# Patient Record
Sex: Male | Born: 1950 | Race: White | Hispanic: No | Marital: Single | Smoking: Former smoker
Health system: Southern US, Community
[De-identification: ages and names within clinical notes are randomized; demographics above are authoritative.]

## PROBLEM LIST (undated history)

## (undated) DIAGNOSIS — I2510 Atherosclerotic heart disease of native coronary artery without angina pectoris: Secondary | ICD-10-CM

## (undated) DIAGNOSIS — K859 Acute pancreatitis without necrosis or infection, unspecified: Secondary | ICD-10-CM

## (undated) DIAGNOSIS — M1990 Unspecified osteoarthritis, unspecified site: Secondary | ICD-10-CM

## (undated) DIAGNOSIS — C801 Malignant (primary) neoplasm, unspecified: Secondary | ICD-10-CM

## (undated) DIAGNOSIS — I10 Essential (primary) hypertension: Secondary | ICD-10-CM

## (undated) DIAGNOSIS — R0602 Shortness of breath: Secondary | ICD-10-CM

## (undated) DIAGNOSIS — F329 Major depressive disorder, single episode, unspecified: Secondary | ICD-10-CM

## (undated) DIAGNOSIS — F32A Depression, unspecified: Secondary | ICD-10-CM

## (undated) DIAGNOSIS — B1920 Unspecified viral hepatitis C without hepatic coma: Secondary | ICD-10-CM

## (undated) HISTORY — PX: ABDOMINAL SURGERY: SHX537

## (undated) HISTORY — PX: CHOLECYSTECTOMY: SHX55

## (undated) HISTORY — PX: FRACTURE SURGERY: SHX138

## (undated) HISTORY — PX: HERNIA REPAIR: SHX51

---

## 1997-08-29 ENCOUNTER — Inpatient Hospital Stay (HOSPITAL_COMMUNITY): Admission: AD | Admit: 1997-08-29 | Discharge: 1997-09-02 | Payer: Self-pay | Admitting: Cardiovascular Disease

## 2007-10-11 ENCOUNTER — Inpatient Hospital Stay: Payer: Self-pay | Admitting: Psychiatry

## 2007-10-11 ENCOUNTER — Other Ambulatory Visit: Payer: Self-pay

## 2008-09-28 ENCOUNTER — Inpatient Hospital Stay: Payer: Self-pay | Admitting: Internal Medicine

## 2009-04-19 ENCOUNTER — Emergency Department: Payer: Self-pay | Admitting: Unknown Physician Specialty

## 2009-05-25 ENCOUNTER — Inpatient Hospital Stay: Payer: Self-pay | Admitting: Internal Medicine

## 2009-05-29 ENCOUNTER — Inpatient Hospital Stay: Payer: Self-pay | Admitting: *Deleted

## 2009-06-16 ENCOUNTER — Ambulatory Visit: Payer: Self-pay | Admitting: Gastroenterology

## 2009-06-19 ENCOUNTER — Inpatient Hospital Stay: Payer: Self-pay | Admitting: Internal Medicine

## 2009-06-23 ENCOUNTER — Ambulatory Visit: Payer: Self-pay | Admitting: Gastroenterology

## 2010-05-25 ENCOUNTER — Ambulatory Visit: Payer: Self-pay | Admitting: Internal Medicine

## 2010-05-27 IMAGING — US US CAROTID DUPLEX BILAT
1 series · 17 of 24 positions shown · non-contrast
Comparison: none

REASON FOR EXAM: syncope
COMMENTS:

[Series 1: us carotid duplex bilat · 17 of 62 slices shown]
[im 1/62]
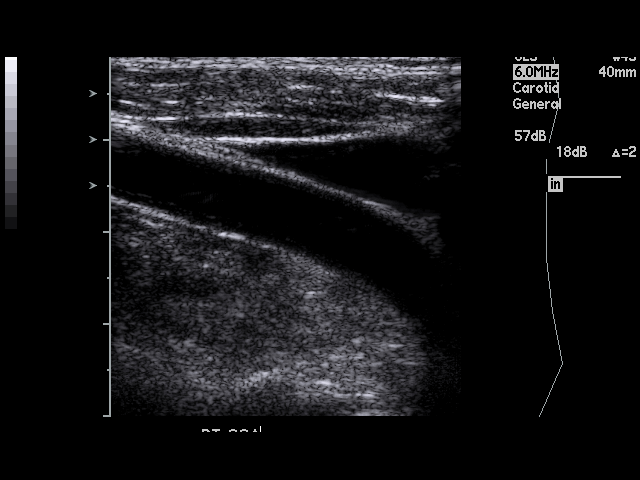
[im 6/62]
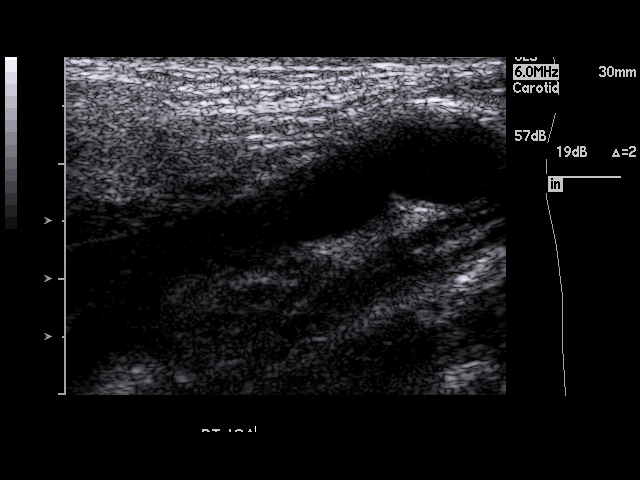
[im 8/62]
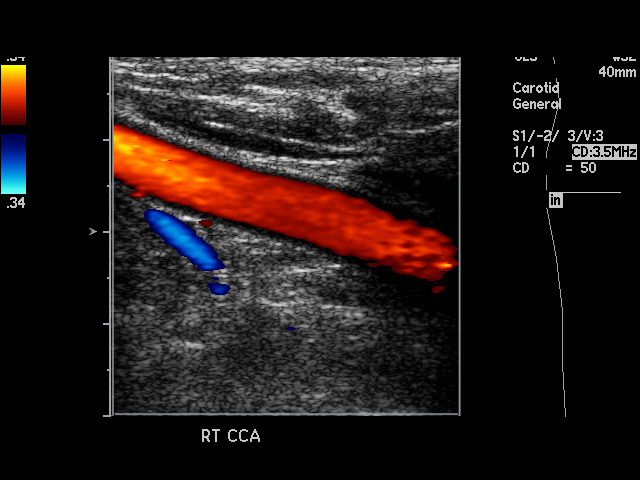
[im 11/62]
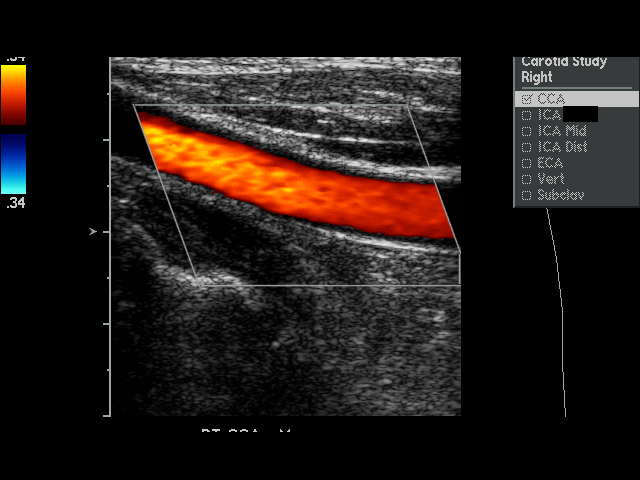
[im 16/62]
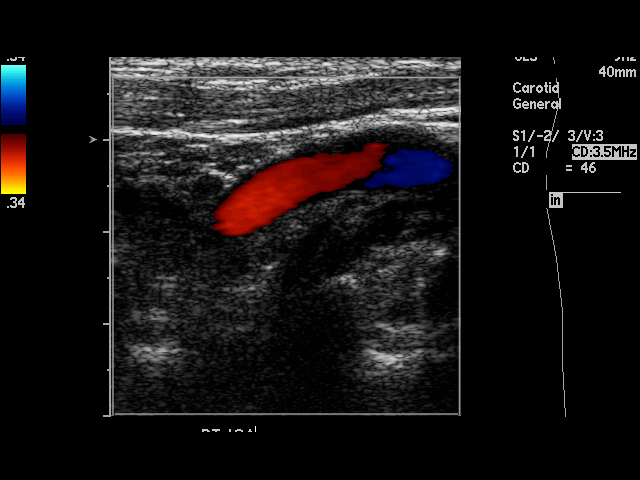
[im 19/62]
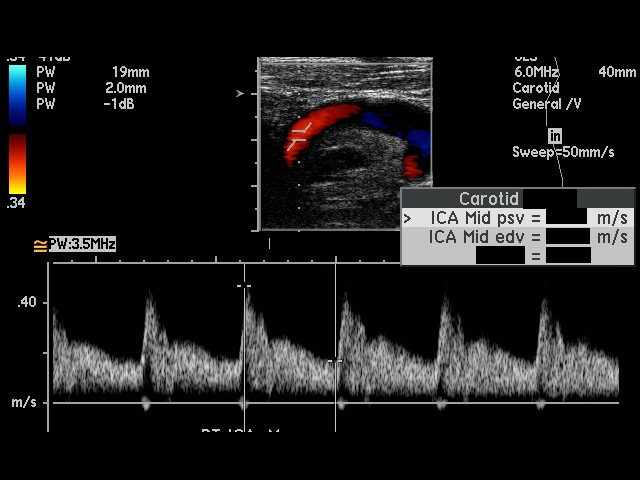
[im 24/62]
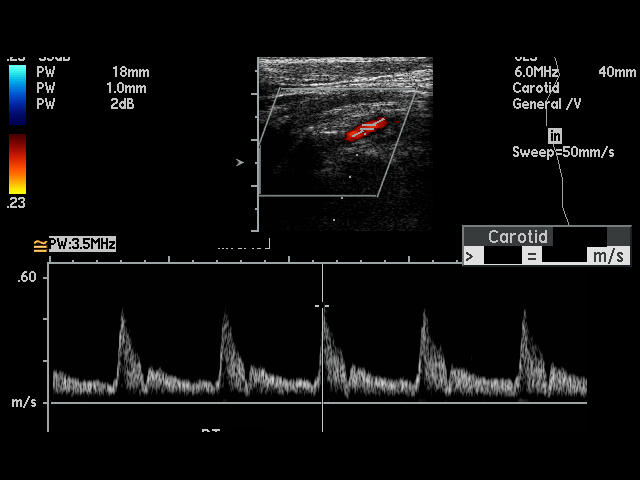
[im 27/62]
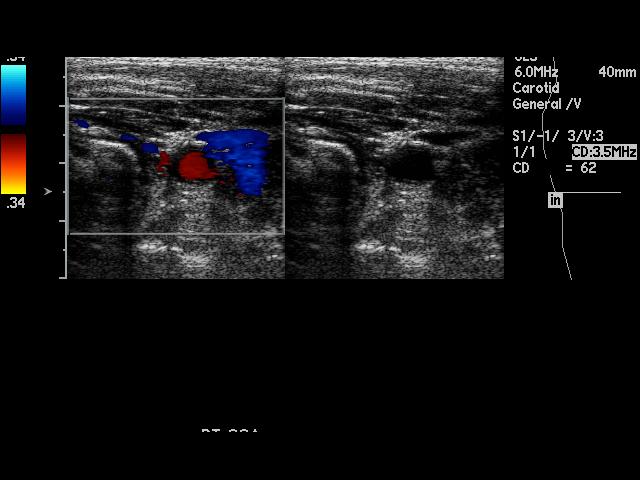
[im 32/62]
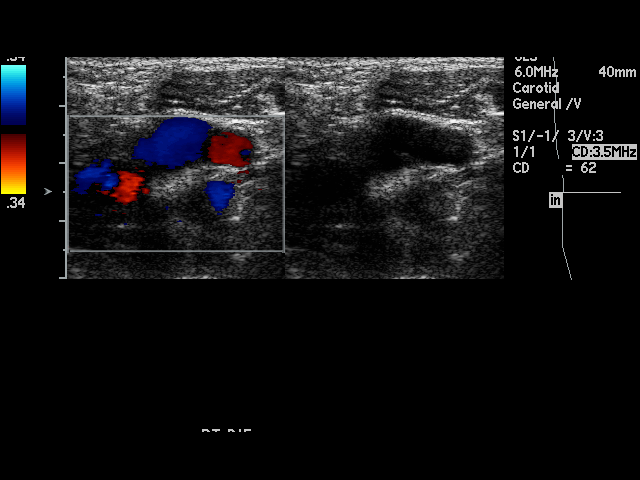
[im 35/62]
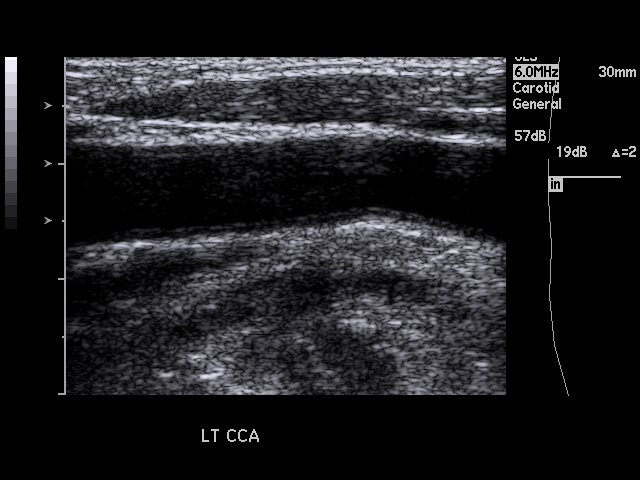
[im 38/62]
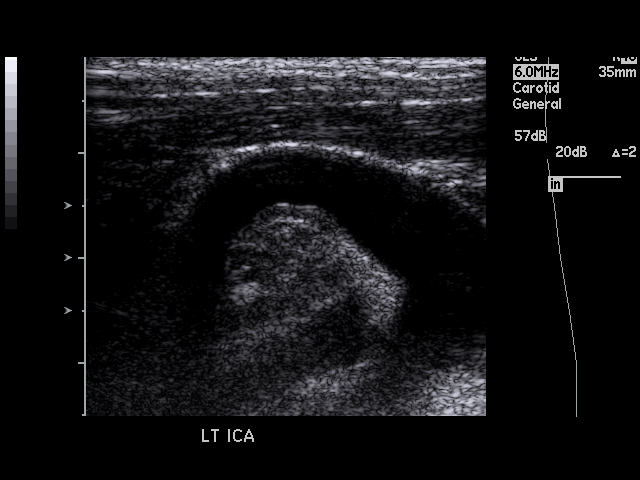
[im 43/62]
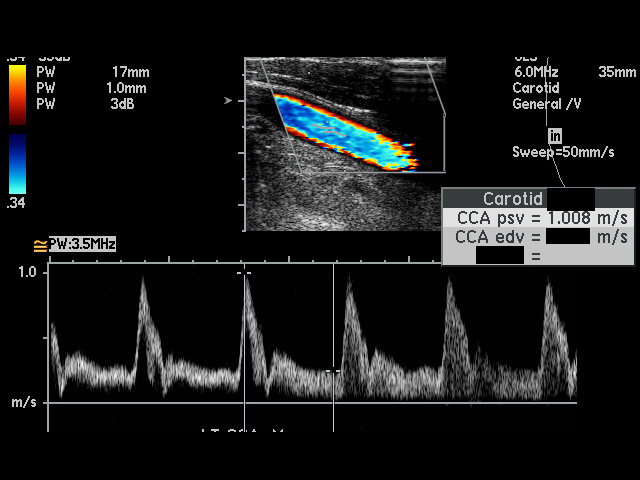
[im 46/62]
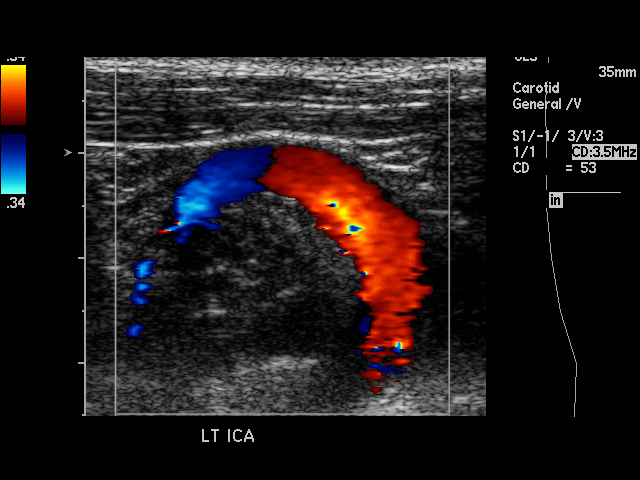
[im 51/62]
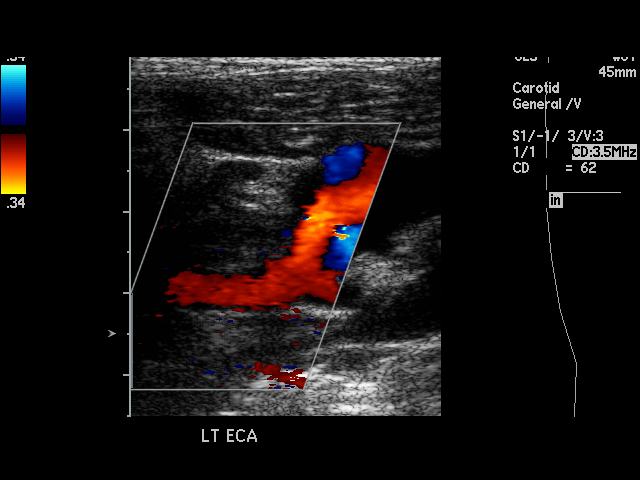
[im 54/62]
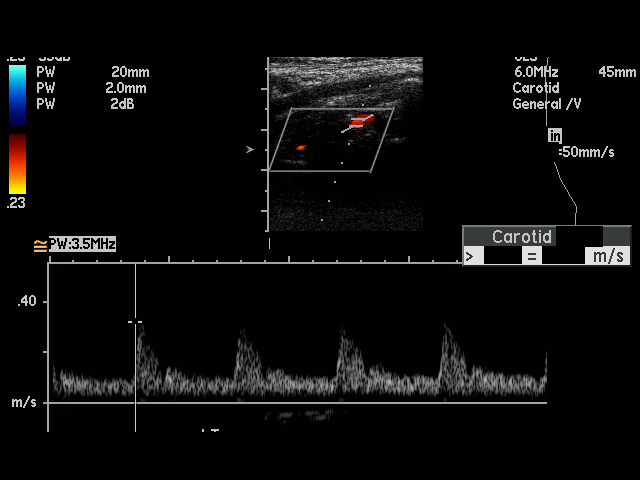
[im 56/62]
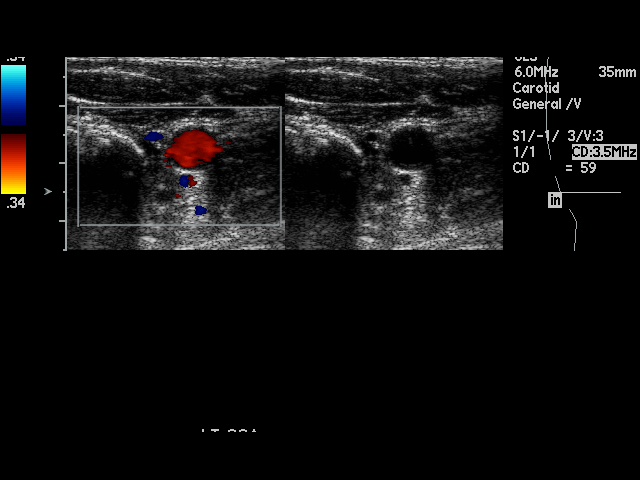
[im 62/62]
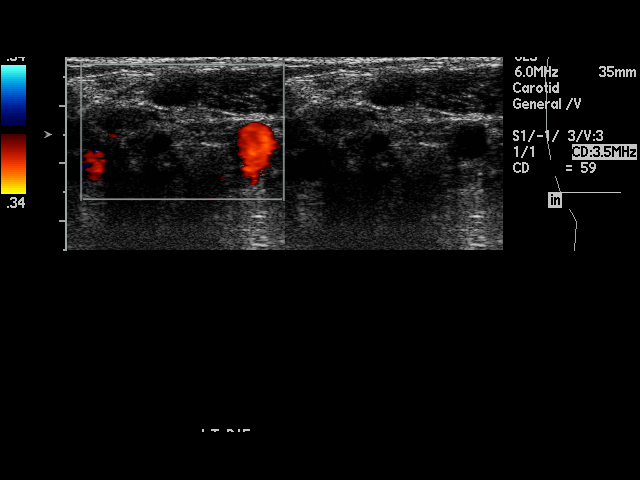

[17 of 24 positions shown; findings below may reference images not displayed]

PROCEDURE:     US  - US CAROTID DOPPLER BILATERAL  - May 26, 2009 [DATE]

RESULT:     Carotid Doppler interrogation demonstrates no appreciable plaque
formation or stenosis. The spectral and color Doppler signals appear normal.
The peak systolic velocities are normal. The internal to common carotid peak
systolic velocity ratio is 0.50 on the right and 0.624 on the left.
IMPRESSION: 1. No significant atherosclerotic disease evident. No evidence of
hemodynamically significant stenosis.
2. Antegrade flow is noted in both vertebral arteries.

## 2010-05-31 ENCOUNTER — Inpatient Hospital Stay: Payer: Self-pay | Admitting: Internal Medicine

## 2010-06-01 LAB — AFP TUMOR MARKER: AFP-Tumor Marker: 6.2 ng/mL (ref 0.0–8.3)

## 2010-06-08 LAB — PATHOLOGY REPORT

## 2010-06-25 ENCOUNTER — Ambulatory Visit: Payer: Self-pay | Admitting: Internal Medicine

## 2010-07-27 ENCOUNTER — Inpatient Hospital Stay: Payer: Self-pay | Admitting: Psychiatry

## 2011-06-01 IMAGING — CR DG CHEST 1V PORT
1 series · 1 of 1 positions shown · non-contrast
Comparison: none

REASON FOR EXAM: chest pain
COMMENTS:

[view not recorded]
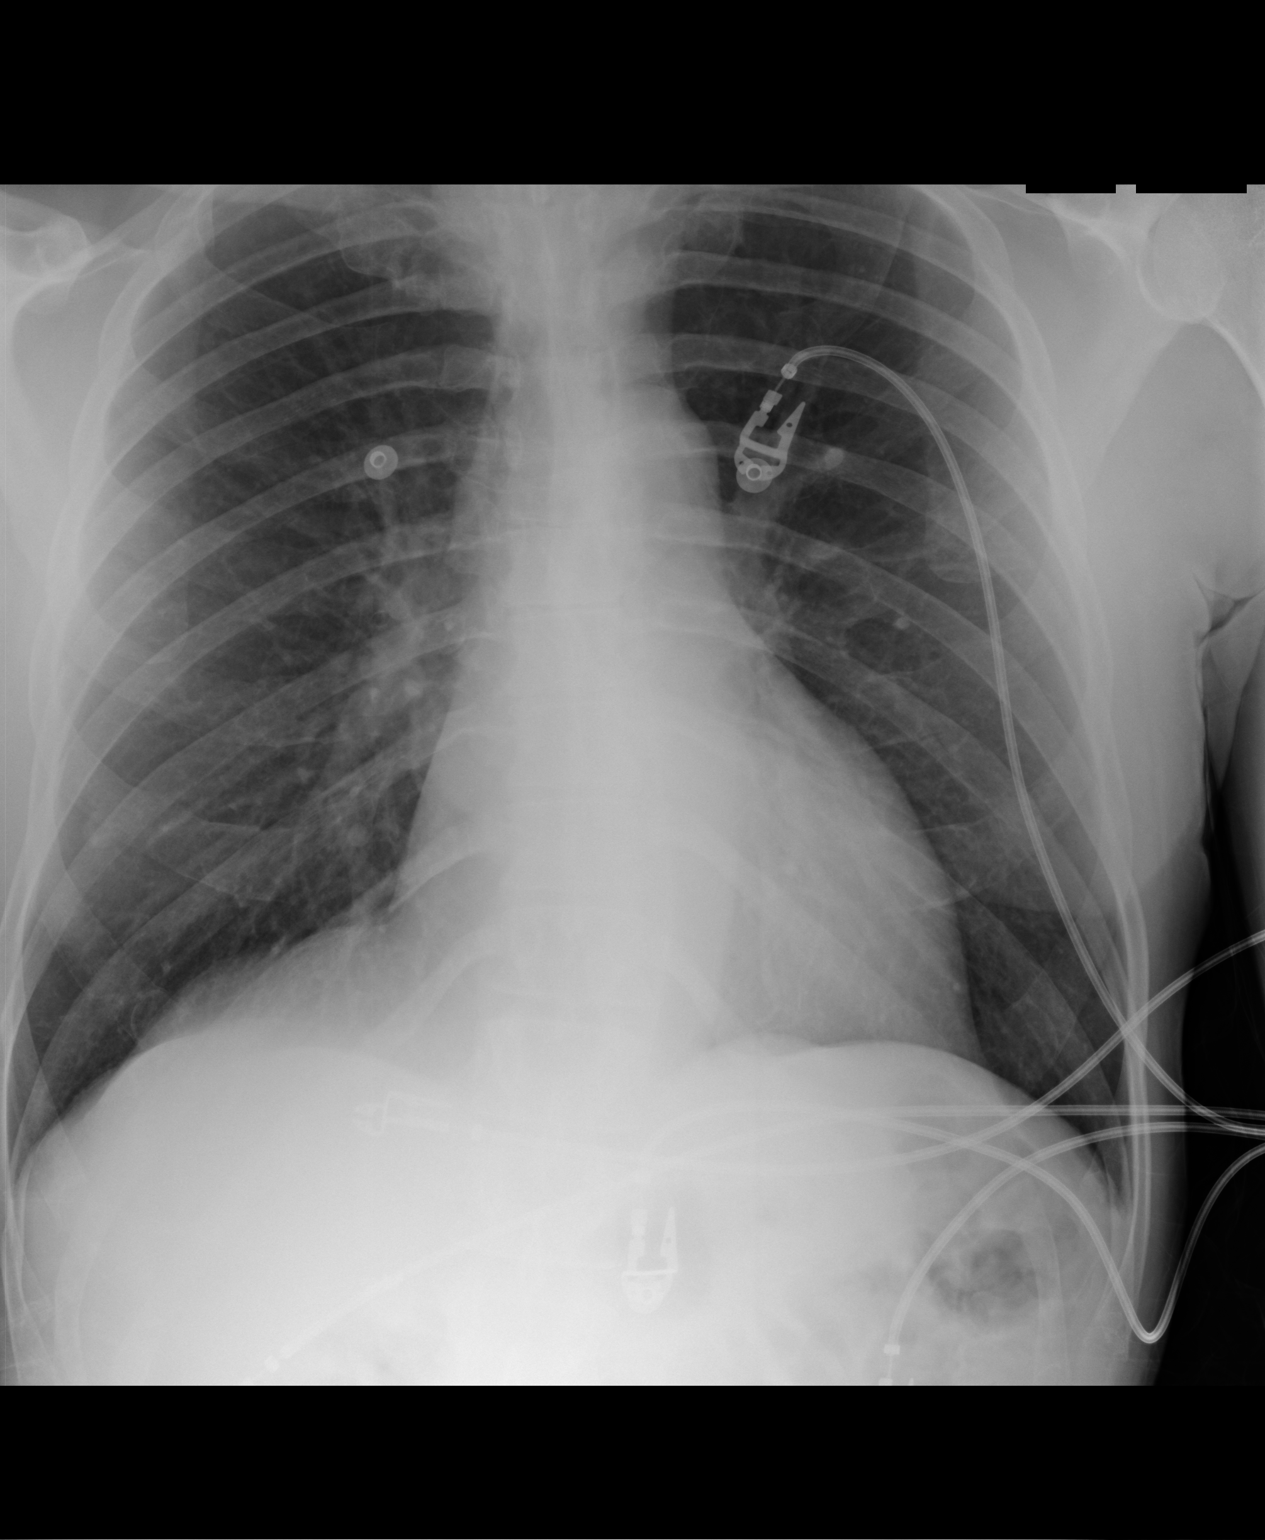

[1 of 1 positions shown; findings below may reference images not displayed]

PROCEDURE:     DXR - DXR PORTABLE CHEST SINGLE VIEW  - May 31, 2010  [DATE]

RESULT:     Comparison is made to the study of 05/25/2009.

Cardiac monitoring electrodes are present. The lungs are clear. The heart
and pulmonary vessels are normal. The bony and mediastinal structures are
unremarkable. There is no effusion. There is no pneumothorax or evidence of
congestive failure.
IMPRESSION: No acute cardiopulmonary disease.

## 2011-06-03 IMAGING — CT CT ABD-PELV W/O CM
1 of 2 series · 14 of 32 positions shown, 18 images · non-contrast
Comparison: none

REASON FOR EXAM: (1) RUQ pain; (2) abdominal pain;    NOTE: Nursing to
Give Oral CT Contrast
COMMENTS:

[Series 2: abdomen · axial · 0.69mm/px · z∈[+445,+850]mm · 14 of 93 slices shown, 18 images]
[im 8/93  soft-tissue]
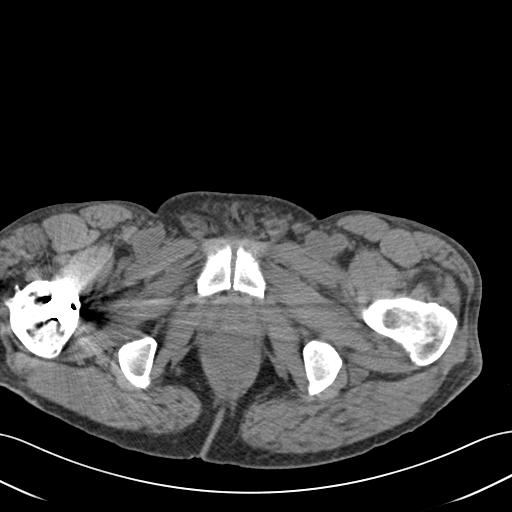
[im 8/93  bone]
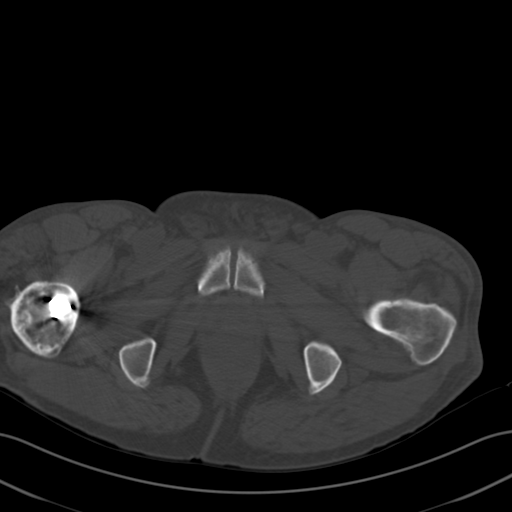
[im 15/93  soft-tissue]
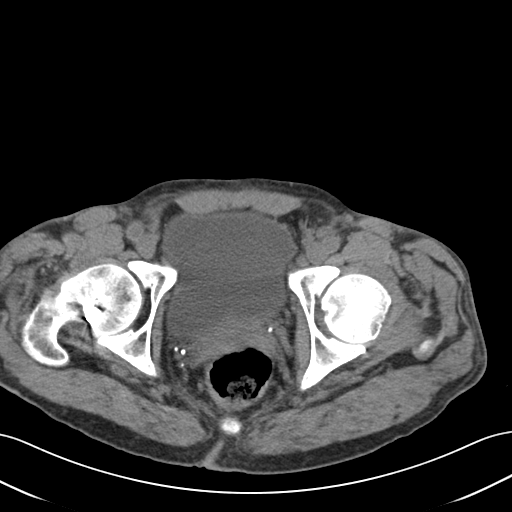
[im 23/93  soft-tissue]
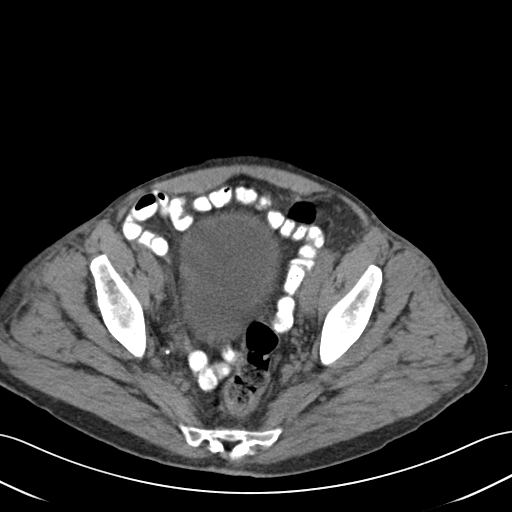
[im 30/93  soft-tissue]
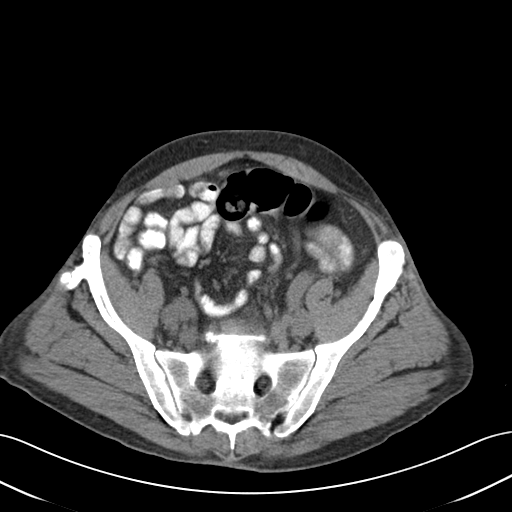
[im 37/93  soft-tissue]
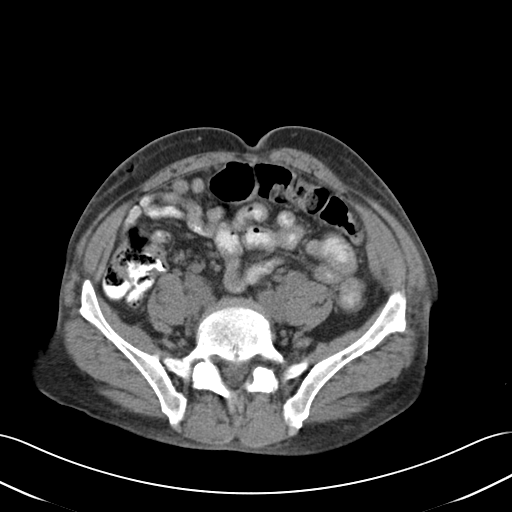
[im 45/93  soft-tissue]
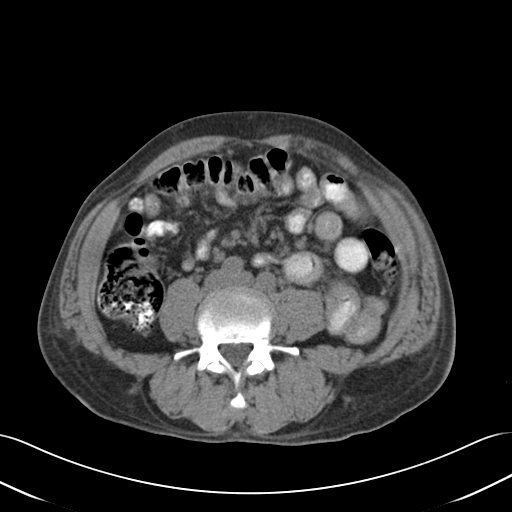
[im 52/93  soft-tissue]
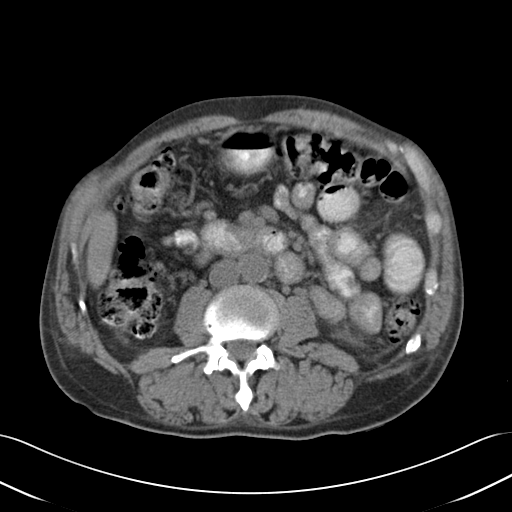
[im 59/93  soft-tissue]
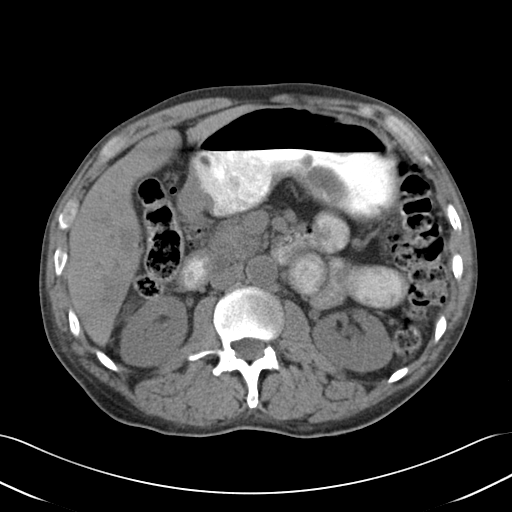
[im 67/93  soft-tissue]
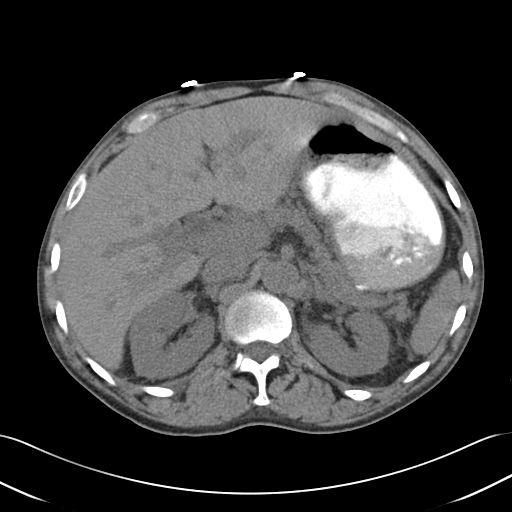
[im 67/93  bone]
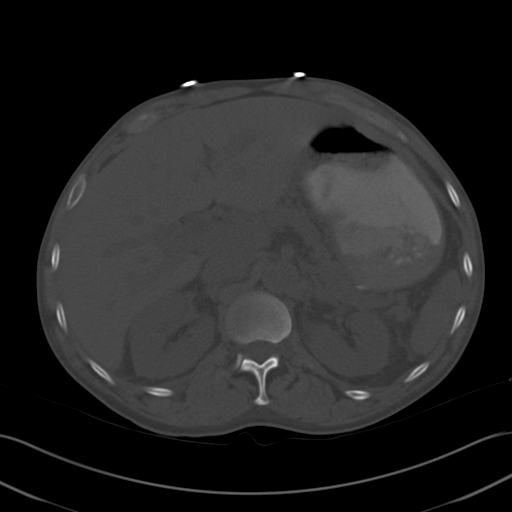
[im 74/93  soft-tissue]
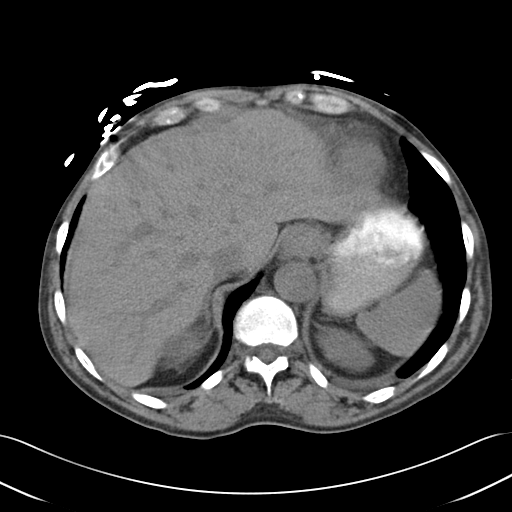
[im 78/93  lung]
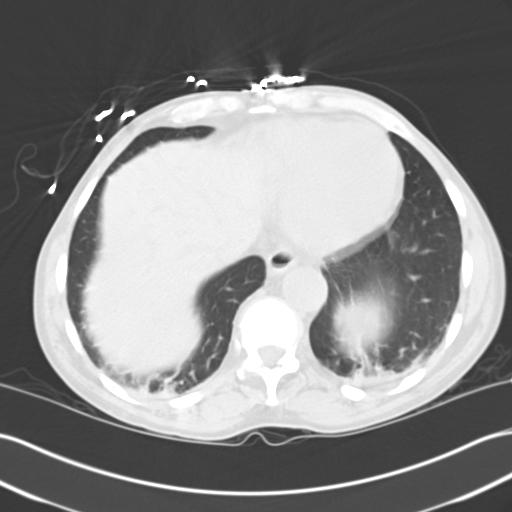
[im 81/93  soft-tissue]
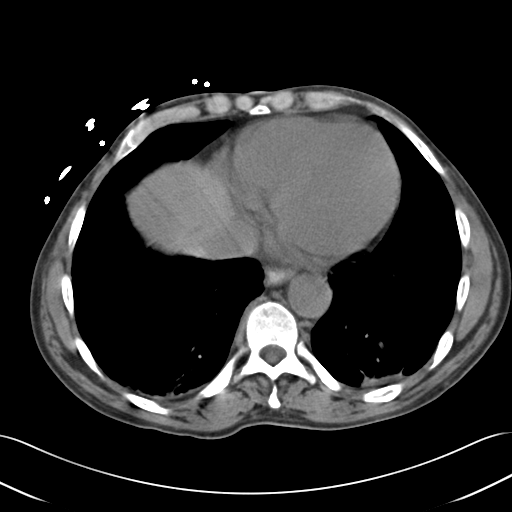
[im 81/93  lung]
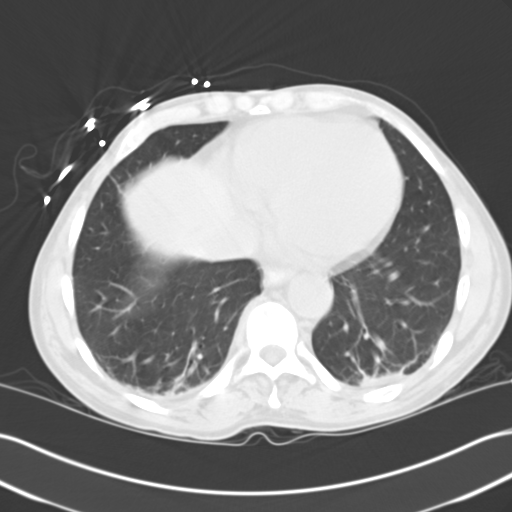
[im 85/93  lung]
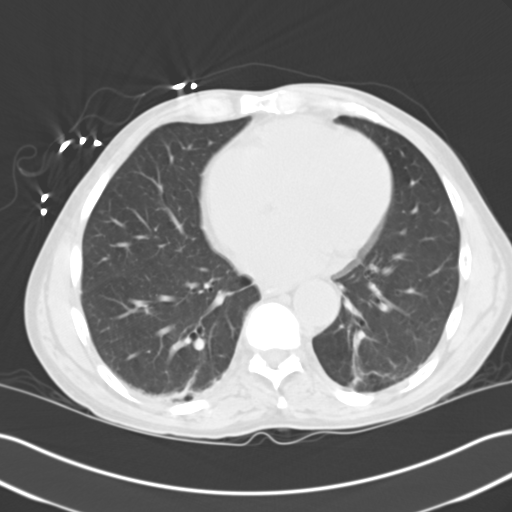
[im 89/93  soft-tissue]
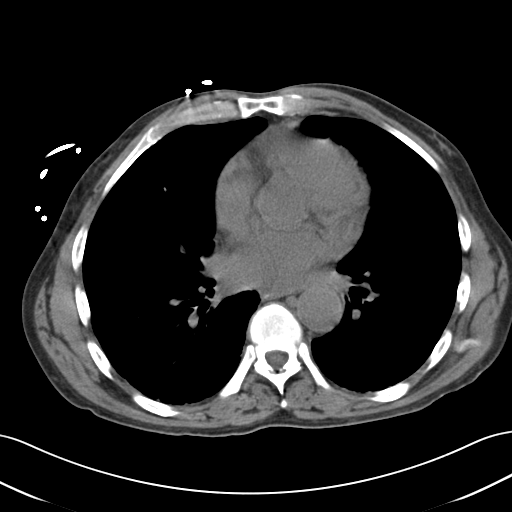
[im 89/93  lung]
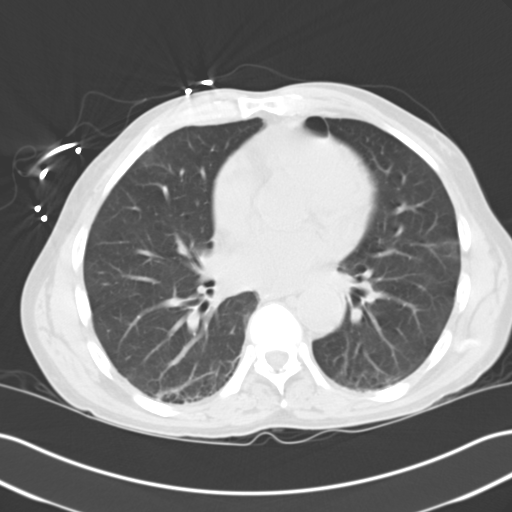

[14 of 32 positions shown; findings below may reference images not displayed]

PROCEDURE:     CT  - CT ABDOMEN AND PELVIS W[DATE]  [DATE]

RESULT:     Axial CT scanning was performed through the abdomen and pelvis
at 5 mm intervals and slice thicknesses following administration of oral
contrast only. The patient has a history of intravenous contrast
sensitivity. Review of multiplanar reconstructed images was performed
separately on the [employer] monitor.

The gallbladder is surgically absent. There is intrahepatic ductal dilation
which is not new. It does appear slightly more conspicuous than on the
earlier study. The spleen is not enlarged. The stomach is moderately
distended with food and contrast and gas. The pancreas exhibits no acute
inflammatory change. The adrenal glands and kidneys exhibit no acute
abnormality. The orally administered contrast is largely within the stomach
and in the the distal small bowel. Only a tiny amount has reached the right
colon. The small and large bowel do not appear obstructed. I see no free
extraluminal gas or fluid or contrast collections. The urinary bladder is
distended. The kidneys exhibit no evidence of obstruction. The abdominal
aorta exhibits failure to taper of its caliber but no evidence of an
aneurysm. The lung bases exhibit emphysematous changes. There is atelectasis
in the posterior costophrenic gutter. The cardiac silhouette is top normal
in size. The lumbar vertebral bodies are preserved in height.
IMPRESSION: 1. Evaluation of the bowel is limited due to the small amount of contrast
present within it. I do not see objective evidence of acute diverticulitis
or other forms of acute bowel inflammation. There is no definite evidence of
obstruction.
2. There is intrahepatic ductal dilation which is not new but is slightly
more conspicuous than on previous studies. The gallbladder is surgically
absent. Dilation of the common bile duct into the pancreatic head is present
and stable.
3. There is new bibasilar atelectasis posteriorly.

If the patient's symptoms remain unexplained, it may be useful to consider
followup scanning after administration of more GI contrast.

## 2013-04-24 ENCOUNTER — Emergency Department (EMERGENCY_DEPARTMENT_HOSPITAL)
Admission: EM | Admit: 2013-04-24 | Discharge: 2013-04-25 | Disposition: A | Discharge status: Home / Self Care | Payer: 59 | Source: Home / Self Care | Attending: Emergency Medicine | Admitting: Emergency Medicine

## 2013-04-24 ENCOUNTER — Encounter (HOSPITAL_COMMUNITY): Payer: Self-pay | Admitting: Emergency Medicine

## 2013-04-24 DIAGNOSIS — I10 Essential (primary) hypertension: Secondary | ICD-10-CM | POA: Insufficient documentation

## 2013-04-24 DIAGNOSIS — M129 Arthropathy, unspecified: Secondary | ICD-10-CM

## 2013-04-24 DIAGNOSIS — F332 Major depressive disorder, recurrent severe without psychotic features: Secondary | ICD-10-CM | POA: Diagnosis present

## 2013-04-24 DIAGNOSIS — R0602 Shortness of breath: Secondary | ICD-10-CM

## 2013-04-24 DIAGNOSIS — B1920 Unspecified viral hepatitis C without hepatic coma: Secondary | ICD-10-CM

## 2013-04-24 DIAGNOSIS — K859 Acute pancreatitis without necrosis or infection, unspecified: Secondary | ICD-10-CM

## 2013-04-24 DIAGNOSIS — F3289 Other specified depressive episodes: Secondary | ICD-10-CM | POA: Insufficient documentation

## 2013-04-24 DIAGNOSIS — F329 Major depressive disorder, single episode, unspecified: Secondary | ICD-10-CM

## 2013-04-24 DIAGNOSIS — F1910 Other psychoactive substance abuse, uncomplicated: Secondary | ICD-10-CM

## 2013-04-24 DIAGNOSIS — Z87891 Personal history of nicotine dependence: Secondary | ICD-10-CM

## 2013-04-24 DIAGNOSIS — R45851 Suicidal ideations: Secondary | ICD-10-CM

## 2013-04-24 DIAGNOSIS — I2510 Atherosclerotic heart disease of native coronary artery without angina pectoris: Secondary | ICD-10-CM

## 2013-04-24 DIAGNOSIS — F1020 Alcohol dependence, uncomplicated: Secondary | ICD-10-CM

## 2013-04-24 DIAGNOSIS — F32A Depression, unspecified: Secondary | ICD-10-CM

## 2013-04-24 HISTORY — DX: Acute pancreatitis without necrosis or infection, unspecified: K85.90

## 2013-04-24 HISTORY — DX: Unspecified osteoarthritis, unspecified site: M19.90

## 2013-04-24 HISTORY — DX: Essential (primary) hypertension: I10

## 2013-04-24 HISTORY — DX: Unspecified viral hepatitis C without hepatic coma: B19.20

## 2013-04-24 HISTORY — DX: Malignant (primary) neoplasm, unspecified: C80.1

## 2013-04-24 HISTORY — DX: Atherosclerotic heart disease of native coronary artery without angina pectoris: I25.10

## 2013-04-24 LAB — COMPREHENSIVE METABOLIC PANEL
ALBUMIN: 4.1 g/dL (ref 3.5–5.2)
ALT: 41 U/L (ref 0–53)
AST: 37 U/L (ref 0–37)
Alkaline Phosphatase: 65 U/L (ref 39–117)
BUN: 16 mg/dL (ref 6–23)
CHLORIDE: 105 meq/L (ref 96–112)
CO2: 20 meq/L (ref 19–32)
Calcium: 8.4 mg/dL (ref 8.4–10.5)
Creatinine, Ser: 1.01 mg/dL (ref 0.50–1.35)
GFR calc Af Amer: 89 mL/min — ABNORMAL LOW (ref >90)
GFR, EST NON AFRICAN AMERICAN: 77 mL/min — AB (ref >90)
Glucose, Bld: 78 mg/dL (ref 70–99)
Potassium: 4.1 mEq/L (ref 3.7–5.3)
Sodium: 141 mEq/L (ref 137–147)
Total Bilirubin: 0.8 mg/dL (ref 0.3–1.2)
Total Protein: 7.6 g/dL (ref 6.0–8.3)

## 2013-04-24 LAB — RAPID URINE DRUG SCREEN, HOSP PERFORMED
AMPHETAMINES: NOT DETECTED
BENZODIAZEPINES: NOT DETECTED
Barbiturates: NOT DETECTED
Cocaine: NOT DETECTED
OPIATES: NOT DETECTED
TETRAHYDROCANNABINOL: NOT DETECTED

## 2013-04-24 LAB — CBC
HCT: 35 % — ABNORMAL LOW (ref 39.0–52.0)
Hemoglobin: 12 g/dL — ABNORMAL LOW (ref 13.0–17.0)
MCH: 27.1 pg (ref 26.0–34.0)
MCHC: 34.3 g/dL (ref 30.0–36.0)
MCV: 79 fL (ref 78.0–100.0)
PLATELETS: 309 10*3/uL (ref 150–400)
RBC: 4.43 MIL/uL (ref 4.22–5.81)
RDW: 15 % (ref 11.5–15.5)
WBC: 6.7 10*3/uL (ref 4.0–10.5)

## 2013-04-24 LAB — LIPASE, BLOOD: Lipase: 24 U/L (ref 11–59)

## 2013-04-24 LAB — ETHANOL: Alcohol, Ethyl (B): 100 mg/dL — ABNORMAL HIGH (ref 0–11)

## 2013-04-24 LAB — ACETAMINOPHEN LEVEL: Acetaminophen (Tylenol), Serum: <15 ug/mL (ref 10–30)

## 2013-04-24 LAB — SALICYLATE LEVEL: Salicylate Lvl: <2 mg/dL — ABNORMAL LOW (ref 2.8–20.0)

## 2013-04-24 MED ORDER — PROMETHAZINE HCL 25 MG PO TABS
12.5000 mg | ORAL_TABLET | ORAL | Status: DC | PRN
  Administered 2013-04-24: 12.5 mg via ORAL
  Filled 2013-04-24: qty 1

## 2013-04-24 MED ORDER — THIAMINE HCL 100 MG/ML IJ SOLN: 100.0000 mg | Freq: Every day | INTRAMUSCULAR | Status: DC

## 2013-04-24 MED ORDER — LORAZEPAM 1 MG PO TABS: 0.0000 mg | ORAL_TABLET | Freq: Two times a day (BID) | ORAL | Status: DC

## 2013-04-24 MED ORDER — PANTOPRAZOLE SODIUM 40 MG PO TBEC
40.0000 mg | DELAYED_RELEASE_TABLET | Freq: Every day | ORAL | Status: DC
  Administered 2013-04-25: 40 mg via ORAL
  Filled 2013-04-24: qty 1

## 2013-04-24 MED ORDER — HYDROXYZINE HCL 25 MG PO TABS
25.0000 mg | ORAL_TABLET | Freq: Three times a day (TID) | ORAL | Status: DC | PRN
  Administered 2013-04-24: 25 mg via ORAL
  Filled 2013-04-24: qty 1

## 2013-04-24 MED ORDER — VITAMIN B-1 100 MG PO TABS
100.0000 mg | ORAL_TABLET | Freq: Every day | ORAL | Status: DC
  Administered 2013-04-24 – 2013-04-25 (×2): 100 mg via ORAL
  Filled 2013-04-24 (×2): qty 1

## 2013-04-24 MED ORDER — TRAMADOL HCL 50 MG PO TABS
50.0000 mg | ORAL_TABLET | Freq: Four times a day (QID) | ORAL | Status: DC | PRN
  Administered 2013-04-25: 50 mg via ORAL
  Filled 2013-04-24: qty 1

## 2013-04-24 MED ORDER — PROCHLORPERAZINE MALEATE 10 MG PO TABS
10.0000 mg | ORAL_TABLET | Freq: Four times a day (QID) | ORAL | Status: DC | PRN
  Filled 2013-04-24: qty 1

## 2013-04-24 MED ORDER — LORAZEPAM 1 MG PO TABS
0.0000 mg | ORAL_TABLET | Freq: Four times a day (QID) | ORAL | Status: DC
Start: 2013-04-24 — End: 2013-04-25
  Administered 2013-04-24: 1 mg via ORAL
  Administered 2013-04-25: 2 mg via ORAL
  Administered 2013-04-25: 1 mg via ORAL
  Filled 2013-04-24 (×2): qty 1
  Filled 2013-04-24: qty 2

## 2013-04-24 NOTE — ED Notes (Signed)
Pt -SI/HI, -A/V hall contracts for safety. Denies pain. CIWA protocol followed. Will continue to monitor closely and maintain safe environment.

## 2013-04-24 NOTE — ED Provider Notes (Signed)
CSN: [id number]     Arrival date & time 04/24/13  1445 History   First MD Initiated Contact with Patient 04/24/13 1518     Chief Complaint  Patient presents with  . Depression  . Medical Clearance     (Consider location/radiation/quality/duration/timing/severity/associated sxs/prior Treatment) The history is provided by the patient.  Elijah [name] is a 63 y.o. 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 He was admitted to behavioral and was discharged several days ago. The last several days he beginning to have some suicidal thoughts. He just "doesn't want to exist" and denies specific plans. Denies hallucinations. Had some epigastric pain but has chronic. Has been taking his trazodone but has been having dreams of him killing himself at night. Had been using cocaine and marijuana but stopped now. Drinks alcohol regularly, most recently yesterday. Still smokes cigarettes.    Past Medical History  Diagnosis Date  . Pancreatitis   . Hypertension   . Cancer   . Arthritis   . Seizures   . Coronary artery disease   . Hepatitis C    Past Surgical History  Procedure Laterality Date  . Abdominal surgery    . Cholecystectomy    . Fracture surgery    . Hernia repair     No family history on file. History  Substance Use Topics  . Smoking status: Current Every Day Smoker  . Smokeless tobacco: Not on file  . Alcohol Use: Yes     Comment: last drink yesterday (couple of beers)    Review of Systems  Psychiatric/Behavioral: Positive for suicidal ideas and dysphoric mood.  All other systems reviewed and are negative.      Allergies  Aspirin; Contrast media; and Ibuprofen  Home Medications   Current Outpatient Rx  Name  Route  Sig  Dispense  Refill  . hydrOXYzine (ATARAX/VISTARIL) 25 MG tablet   Oral   Take 25 mg by mouth 3 (three) times daily as needed for anxiety.         . pantoprazole (PROTONIX) 40 MG tablet   Oral   Take 40 mg by  mouth daily.         Marland Kitchen PARoxetine (PAXIL) 20 MG tablet   Oral   Take 20 mg by mouth daily.         . prochlorperazine (COMPAZINE) 10 MG tablet   Oral   Take 10 mg by mouth every 6 (six) hours as needed for nausea or vomiting.         . thiamine 100 MG tablet   Oral   Take 100 mg by mouth daily.         . traMADol (ULTRAM) 50 MG tablet   Oral   Take 50 mg by mouth every 6 (six) hours as needed for moderate pain.         . traZODone (DESYREL) 100 MG tablet   Oral   Take 100 mg by mouth at bedtime.          BP 150/99  Pulse 95  Temp(Src) 98.1 F (36.7 C)  Resp 18  SpO2 95% Physical Exam  Nursing note and vitals reviewed. Constitutional: He is oriented to person, place, and time. He appears well-nourished.  Depressed   HENT:  Head: Normocephalic.  Mouth/Throat: Oropharynx is clear and moist.  Eyes: Conjunctivae and EOM are normal. Pupils are equal, round, and reactive to light.  Neck: Normal range of motion. Neck supple.  Cardiovascular: Normal rate, regular  rhythm and normal heart sounds.   Pulmonary/Chest: Effort normal and breath sounds normal. No respiratory distress. He has no wheezes. He has no rales.  Abdominal: Soft. Bowel sounds are normal. He exhibits no distension. There is no tenderness. There is no rebound and no guarding.  Musculoskeletal: Normal range of motion. He exhibits no edema and no tenderness.  Neurological: He is alert and oriented to person, place, and time. No cranial nerve deficit. Coordination normal.  Skin: Skin is warm and dry.  Psychiatric: He has a normal mood and affect. His behavior is normal. Judgment and thought content normal.    ED Course  Procedures (including critical care time) Labs Review Labs Reviewed  CBC - Abnormal; Notable for the following:    Hemoglobin 12.0 (*)    HCT 35.0 (*)    All other components within normal limits  COMPREHENSIVE METABOLIC PANEL - Abnormal; Notable for the following:    GFR calc non  Af Amer 77 (*)    GFR calc Af Amer 89 (*)    All other components within normal limits  ETHANOL - Abnormal; Notable for the following:    Alcohol, Ethyl (B) 100 (*)    All other components within normal limits  SALICYLATE LEVEL - Abnormal; Notable for the following:    Salicylate Lvl <4.4 (*)    All other components within normal limits  ACETAMINOPHEN LEVEL  URINE RAPID DRUG SCREEN (HOSP PERFORMED)  LIPASE, BLOOD   Imaging Review No results found.   EKG Interpretation None      MDM   Final diagnoses:  None   Elijah [name] is a 63 y.o. male here with depression. Will get TTS consult. Will check labs.   5:32 PM ETOH 100. UDS neg. Accepted to transfer at [facility] under Dr. Ashok Cordia.    Wandra Arthurs, MD 04/24/13 [phone]

## 2013-04-24 NOTE — ED Notes (Signed)
Report called to Randall Hiss at [place].

## 2013-04-24 NOTE — ED Notes (Signed)
Pt recently treated at [facility].  Pt continues to have thoughts of self harm with no plan at this time.

## 2013-04-24 NOTE — ED Provider Notes (Signed)
Pt transferred from [facility] ED to [place] ED for psych team evaluation, treatment, and possible placement. Pt alert, content, nad, vital signs stable.     Mirna Mires, MD 04/24/13 [phone]

## 2013-04-24 NOTE — ED Notes (Signed)
Notified [facility] for transportation.

## 2013-04-24 NOTE — ED Notes (Signed)
Pt reporting a cluster headache.  Medication to be ordered by Dr. Darl Householder.

## 2013-04-25 ENCOUNTER — Encounter (HOSPITAL_COMMUNITY): Payer: Self-pay

## 2013-04-25 ENCOUNTER — Inpatient Hospital Stay (HOSPITAL_COMMUNITY)
Admission: AD | Admit: 2013-04-25 | Discharge: 2013-05-01 | DRG: 897 | Disposition: A | Discharge status: Rehabilitation Facility | Payer: 59 | Source: Intra-hospital | Attending: Emergency Medicine | Admitting: Emergency Medicine

## 2013-04-25 DIAGNOSIS — F332 Major depressive disorder, recurrent severe without psychotic features: Secondary | ICD-10-CM

## 2013-04-25 DIAGNOSIS — W19XXXA Unspecified fall, initial encounter: Secondary | ICD-10-CM

## 2013-04-25 DIAGNOSIS — F1020 Alcohol dependence, uncomplicated: Principal | ICD-10-CM | POA: Diagnosis present

## 2013-04-25 DIAGNOSIS — R45851 Suicidal ideations: Secondary | ICD-10-CM

## 2013-04-25 DIAGNOSIS — F329 Major depressive disorder, single episode, unspecified: Secondary | ICD-10-CM

## 2013-04-25 DIAGNOSIS — F3289 Other specified depressive episodes: Secondary | ICD-10-CM

## 2013-04-25 DIAGNOSIS — Z85028 Personal history of other malignant neoplasm of stomach: Secondary | ICD-10-CM

## 2013-04-25 DIAGNOSIS — G4700 Insomnia, unspecified: Secondary | ICD-10-CM | POA: Diagnosis present

## 2013-04-25 DIAGNOSIS — M129 Arthropathy, unspecified: Secondary | ICD-10-CM | POA: Diagnosis present

## 2013-04-25 DIAGNOSIS — F411 Generalized anxiety disorder: Secondary | ICD-10-CM | POA: Diagnosis present

## 2013-04-25 DIAGNOSIS — F1994 Other psychoactive substance use, unspecified with psychoactive substance-induced mood disorder: Secondary | ICD-10-CM | POA: Diagnosis present

## 2013-04-25 DIAGNOSIS — R55 Syncope and collapse: Secondary | ICD-10-CM | POA: Diagnosis not present

## 2013-04-25 DIAGNOSIS — Z634 Disappearance and death of family member: Secondary | ICD-10-CM

## 2013-04-25 DIAGNOSIS — I10 Essential (primary) hypertension: Secondary | ICD-10-CM | POA: Diagnosis present

## 2013-04-25 DIAGNOSIS — I2510 Atherosclerotic heart disease of native coronary artery without angina pectoris: Secondary | ICD-10-CM | POA: Diagnosis present

## 2013-04-25 DIAGNOSIS — Z87891 Personal history of nicotine dependence: Secondary | ICD-10-CM

## 2013-04-25 HISTORY — DX: Depression, unspecified: F32.A

## 2013-04-25 HISTORY — DX: Major depressive disorder, single episode, unspecified: F32.9

## 2013-04-25 HISTORY — DX: Shortness of breath: R06.02

## 2013-04-25 MED ORDER — VITAMIN B-1 100 MG PO TABS
100.0000 mg | ORAL_TABLET | Freq: Every day | ORAL | Status: DC
  Administered 2013-04-26 – 2013-05-01 (×6): 100 mg via ORAL
  Filled 2013-04-25 (×3): qty 1
  Filled 2013-04-25: qty 14
  Filled 2013-04-25 (×6): qty 1

## 2013-04-25 MED ORDER — ALUM & MAG HYDROXIDE-SIMETH 200-200-20 MG/5ML PO SUSP: 30.0000 mL | ORAL | Status: DC | PRN

## 2013-04-25 MED ORDER — TRAZODONE HCL 100 MG PO TABS
100.0000 mg | ORAL_TABLET | Freq: Every day | ORAL | Status: DC
  Administered 2013-04-25 – 2013-04-29 (×5): 100 mg via ORAL
  Filled 2013-04-25 (×4): qty 1
  Filled 2013-04-25: qty 14
  Filled 2013-04-25 (×6): qty 1

## 2013-04-25 MED ORDER — CHLORDIAZEPOXIDE HCL 25 MG PO CAPS: 25.0000 mg | ORAL_CAPSULE | Freq: Three times a day (TID) | ORAL | Status: DC

## 2013-04-25 MED ORDER — CHLORDIAZEPOXIDE HCL 25 MG PO CAPS: 25.0000 mg | ORAL_CAPSULE | Freq: Four times a day (QID) | ORAL | Status: AC | PRN

## 2013-04-25 MED ORDER — PAROXETINE HCL 20 MG PO TABS
20.0000 mg | ORAL_TABLET | Freq: Every day | ORAL | Status: DC
  Administered 2013-04-25 – 2013-04-29 (×5): 20 mg via ORAL
  Filled 2013-04-25 (×8): qty 1

## 2013-04-25 MED ORDER — CHLORDIAZEPOXIDE HCL 25 MG PO CAPS
25.0000 mg | ORAL_CAPSULE | Freq: Once | ORAL | Status: AC
  Administered 2013-04-27: 25 mg via ORAL
  Filled 2013-04-25 (×2): qty 1

## 2013-04-25 MED ORDER — CHLORDIAZEPOXIDE HCL 25 MG PO CAPS
25.0000 mg | ORAL_CAPSULE | ORAL | Status: AC
  Administered 2013-04-28: 25 mg via ORAL
  Filled 2013-04-25 (×2): qty 1

## 2013-04-25 MED ORDER — PANTOPRAZOLE SODIUM 40 MG PO TBEC
40.0000 mg | DELAYED_RELEASE_TABLET | Freq: Every day | ORAL | Status: DC
  Administered 2013-04-26 – 2013-05-01 (×6): 40 mg via ORAL
  Filled 2013-04-25: qty 1
  Filled 2013-04-25: qty 14
  Filled 2013-04-25 (×10): qty 1

## 2013-04-25 MED ORDER — ACETAMINOPHEN 325 MG PO TABS
650.0000 mg | ORAL_TABLET | Freq: Four times a day (QID) | ORAL | Status: DC | PRN
  Administered 2013-04-27: 650 mg via ORAL
  Filled 2013-04-25: qty 2

## 2013-04-25 MED ORDER — ADULT MULTIVITAMIN W/MINERALS CH
1.0000 | ORAL_TABLET | Freq: Every day | ORAL | Status: DC
  Administered 2013-04-26 – 2013-05-01 (×6): 1 via ORAL
  Filled 2013-04-25 (×11): qty 1

## 2013-04-25 MED ORDER — MAGNESIUM HYDROXIDE 400 MG/5ML PO SUSP: 30.0000 mL | Freq: Every day | ORAL | Status: DC | PRN

## 2013-04-25 MED ORDER — CHLORDIAZEPOXIDE HCL 25 MG PO CAPS: 25.0000 mg | ORAL_CAPSULE | Freq: Every day | ORAL | Status: DC

## 2013-04-25 MED ORDER — CHLORDIAZEPOXIDE HCL 25 MG PO CAPS
25.0000 mg | ORAL_CAPSULE | Freq: Every day | ORAL | Status: AC
  Administered 2013-04-29: 25 mg via ORAL
  Filled 2013-04-25: qty 1

## 2013-04-25 MED ORDER — THIAMINE HCL 100 MG/ML IJ SOLN: 100.0000 mg | Freq: Once | INTRAMUSCULAR | Status: DC

## 2013-04-25 MED ORDER — CHLORDIAZEPOXIDE HCL 25 MG PO CAPS
25.0000 mg | ORAL_CAPSULE | Freq: Four times a day (QID) | ORAL | Status: AC
  Administered 2013-04-25 – 2013-04-26 (×4): 25 mg via ORAL
  Filled 2013-04-25 (×4): qty 1

## 2013-04-25 MED ORDER — THIAMINE HCL 100 MG/ML IJ SOLN
100.0000 mg | Freq: Once | INTRAMUSCULAR | Status: DC
Start: 2013-04-25 — End: 2013-04-25

## 2013-04-25 MED ORDER — CHLORDIAZEPOXIDE HCL 25 MG PO CAPS
25.0000 mg | ORAL_CAPSULE | Freq: Three times a day (TID) | ORAL | Status: AC
  Administered 2013-04-26 – 2013-04-27 (×3): 25 mg via ORAL
  Filled 2013-04-25 (×2): qty 1

## 2013-04-25 MED ORDER — HYDROXYZINE HCL 25 MG PO TABS
25.0000 mg | ORAL_TABLET | Freq: Four times a day (QID) | ORAL | Status: AC | PRN
  Administered 2013-04-25 – 2013-04-27 (×2): 25 mg via ORAL
  Filled 2013-04-25 (×2): qty 1

## 2013-04-25 MED ORDER — CHLORDIAZEPOXIDE HCL 25 MG PO CAPS: 25.0000 mg | ORAL_CAPSULE | Freq: Four times a day (QID) | ORAL | Status: DC

## 2013-04-25 MED ORDER — HYDROXYZINE HCL 25 MG PO TABS: 25.0000 mg | ORAL_TABLET | Freq: Four times a day (QID) | ORAL | Status: DC | PRN

## 2013-04-25 MED ORDER — LOPERAMIDE HCL 2 MG PO CAPS: 2.0000 mg | ORAL_CAPSULE | ORAL | Status: AC | PRN

## 2013-04-25 MED ORDER — ADULT MULTIVITAMIN W/MINERALS CH
1.0000 | ORAL_TABLET | Freq: Every day | ORAL | Status: DC
  Administered 2013-04-25: 1 via ORAL
  Filled 2013-04-25: qty 1

## 2013-04-25 MED ORDER — TRAZODONE HCL 50 MG PO TABS
50.0000 mg | ORAL_TABLET | Freq: Every evening | ORAL | Status: DC | PRN
Start: 2013-04-25 — End: 2013-04-25

## 2013-04-25 MED ORDER — CHLORDIAZEPOXIDE HCL 25 MG PO CAPS: 25.0000 mg | ORAL_CAPSULE | ORAL | Status: DC

## 2013-04-25 MED ORDER — ONDANSETRON 4 MG PO TBDP: 4.0000 mg | ORAL_TABLET | Freq: Four times a day (QID) | ORAL | Status: AC | PRN

## 2013-04-25 MED ORDER — MAGNESIUM HYDROXIDE 400 MG/5ML PO SUSP
30.0000 mL | Freq: Every day | ORAL | Status: DC | PRN
  Filled 2013-04-25: qty 30

## 2013-04-25 MED ORDER — ONDANSETRON 4 MG PO TBDP: 4.0000 mg | ORAL_TABLET | Freq: Four times a day (QID) | ORAL | Status: DC | PRN

## 2013-04-25 MED ORDER — CHLORDIAZEPOXIDE HCL 25 MG PO CAPS: 25.0000 mg | ORAL_CAPSULE | Freq: Four times a day (QID) | ORAL | Status: DC | PRN

## 2013-04-25 MED ORDER — ACETAMINOPHEN 325 MG PO TABS: 650.0000 mg | ORAL_TABLET | Freq: Four times a day (QID) | ORAL | Status: DC | PRN

## 2013-04-25 MED ORDER — LOPERAMIDE HCL 2 MG PO CAPS: 2.0000 mg | ORAL_CAPSULE | ORAL | Status: DC | PRN

## 2013-04-25 MED ORDER — VITAMIN B-1 100 MG PO TABS: 100.0000 mg | ORAL_TABLET | Freq: Every day | ORAL | Status: DC

## 2013-04-25 MED ORDER — NICOTINE 21 MG/24HR TD PT24
21.0000 mg | MEDICATED_PATCH | Freq: Every day | TRANSDERMAL | Status: DC
  Administered 2013-04-29 – 2013-05-01 (×3): 21 mg via TRANSDERMAL
  Filled 2013-04-25 (×10): qty 1

## 2013-04-25 MED ORDER — LISINOPRIL 10 MG PO TABS
10.0000 mg | ORAL_TABLET | Freq: Every day | ORAL | Status: DC
  Administered 2013-04-25 – 2013-05-01 (×7): 10 mg via ORAL
  Filled 2013-04-25 (×5): qty 1
  Filled 2013-04-25: qty 2
  Filled 2013-04-25 (×2): qty 1
  Filled 2013-04-25: qty 14
  Filled 2013-04-25: qty 2
  Filled 2013-04-25 (×2): qty 1

## 2013-04-25 NOTE — Tx Team (Signed)
Initial Interdisciplinary Treatment Plan  PATIENT STRENGTHS: (choose at least two) Ability for insight Average or above average intelligence Capable of independent living Communication skills Motivation for treatment/growth Supportive family/friends  PATIENT STRESSORS: Substance abuse   PROBLEM LIST: Problem List/Patient Goals Date to be addressed Date deferred Reason deferred Estimated date of resolution  Depression with SI 04/25/13     ETOH abuse 04/25/13                                                DISCHARGE CRITERIA:  Ability to meet basic life and health needs Improved stabilization in mood, thinking, and/or behavior Reduction of life-threatening or endangering symptoms to within safe limits Withdrawal symptoms are absent or subacute and managed without 24-hour nursing intervention  PRELIMINARY DISCHARGE PLAN: Outpatient therapy Return to previous living arrangement  PATIENT/FAMIILY INVOLVEMENT: This treatment plan has been presented to and reviewed with the patient, PRUITT TABOADA, and/or family member, .  The patient and family have been given the opportunity to ask questions and make suggestions.  Maureen Chatters [facility] 04/25/2013, 5:24 PM

## 2013-04-25 NOTE — BH Assessment (Signed)
Assessment Note  Elijah [name] is a 63 year old African American male reporting SI without a plan.  Patient reports increased depression due to the death of his brother last month.   Patient reports that he is not able to contract for safety.   Patient reports a previous admission to [facility] 10 days ago due to suicidal ideations.  Patient reports that he just "doesn't want to exist" and denies specific plans.   Patient reports a previous history of substance abuse.  Patient reports that he received treatment and detox at [facility] several years ago.  Patient reports that his last use was yesterday; however, the patient reports that he was not able to remember how much he drank.  Patient BAL is 100.  Patient reports that he does not remember how much he drinks on a daily basis.  Patient reports that he, "thinks that he has been addicted to alcohol for several years but he is not sure how long it has been".  Patient reports that he drinks so that he does not have to feel so depressed.   Patient reports living with a roommate that this currently taking care of his dog. Patient denies physical sexual or emotional abuse.   Patient denies psychosis. Patient denies HI.    Axis I: Major Depression, Recurrent severe and Alcohol Dependence Axis II: Deferred Axis III:  Past Medical History  Diagnosis Date  . Pancreatitis   . Hypertension   . Cancer   . Arthritis   . Seizures   . Coronary artery disease   . Hepatitis C    Axis IV: economic problems, other psychosocial or environmental problems, problems related to social environment, problems with access to health care services and problems with primary support group Axis V: 31-40 impairment in reality testing  Past Medical History:  Past Medical History  Diagnosis Date  . Pancreatitis   . Hypertension   . Cancer   . Arthritis   . Seizures   . Coronary artery disease   . Hepatitis C     Past Surgical History  Procedure  Laterality Date  . Abdominal surgery    . Cholecystectomy    . Fracture surgery    . Hernia repair      Family History: No family history on file.  Social History:  reports that he has been smoking.  He does not have any smokeless tobacco history on file. He reports that he drinks alcohol. He reports that he uses illicit drugs (Cocaine and Marijuana).  Additional Social History:     CIWA: CIWA-Ar BP: 160/100 mmHg Pulse Rate: 94 Nausea and Vomiting: no nausea and no vomiting Tactile Disturbances: none Tremor: two Auditory Disturbances: not present Paroxysmal Sweats: two Visual Disturbances: not present Anxiety: three Headache, Fullness in Head: none present Agitation: normal activity Orientation and Clouding of Sensorium: oriented and can do serial additions CIWA-Ar Total: 7 COWS:    Allergies:  Allergies  Allergen Reactions  . Aspirin   . Contrast Media [Iodinated Diagnostic Agents]   . Ibuprofen     Home Medications:  (Not in a hospital admission)  OB/GYN Status:  No LMP for male patient.  General Assessment Data Location of Assessment: [facility] ED Is this a Tele or Face-to-Face Assessment?: Face-to-Face Is this an Initial Assessment or a Re-assessment for this encounter?: Initial Assessment Living Arrangements: Other (Comment) (Pt has a room mate ) Can pt return to current living arrangement?: Yes Admission Status: Voluntary Is patient capable of  signing voluntary admission?: Yes Transfer from: Acute Hospital Referral Source: Self/Family/Friend  Medical Screening Exam ([place]) Medical Exam completed: Yes  [place] Living Arrangements: Other (Comment) (Pt has a room mate ) Name of Psychiatrist: None Reported Name of Therapist: None Reported  Education Status Is patient currently in school?: No Current Grade: NA Highest grade of school patient has completed: NA Name of school: NA Contact person: NA  Risk to self Suicidal Ideation:  Yes-Currently Present Suicidal Intent: Yes-Currently Present Is patient at risk for suicide?: Yes Suicidal Plan?: Yes-Currently Present Specify Current Suicidal Plan: None Reported Access to Means: No What has been your use of drugs/alcohol within the last 12 months?: Alcohol Previous Attempts/Gestures: Yes How many times?: 1 Other Self Harm Risks: None Reported Triggers for Past Attempts: Other (Comment) (Brother died last month ) Intentional Self Injurious Behavior: None Family Suicide History: No Recent stressful life event(s): Loss (Comment);Financial Problems Persecutory voices/beliefs?: No Depression: Yes Depression Symptoms: Despondent;Insomnia;Tearfulness;Isolating;Fatigue;Guilt;Loss of interest in usual pleasures;Feeling worthless/self pity Substance abuse history and/or treatment for substance abuse?: Yes Suicide prevention information given to non-admitted patients: Yes  Risk to Others Homicidal Ideation: No Thoughts of Harm to Others: No Current Homicidal Intent: No Current Homicidal Plan: No Access to Homicidal Means: No Identified Victim: None Reported History of harm to others?: No Assessment of Violence: None Noted Violent Behavior Description: Calm and cooperative Does patient have access to weapons?: No Criminal Charges Pending?: No Does patient have a court date: No  Psychosis Hallucinations: None noted Delusions: None noted  Mental Status Report Appear/Hygiene: Disheveled;Body odor Eye Contact: Fair Motor Activity: Freedom of movement Speech: Logical/coherent Level of Consciousness: Quiet/awake Mood: Depressed;Anxious Affect: Anxious Anxiety Level: Minimal Thought Processes: Coherent;Relevant Judgement: Unimpaired Orientation: Person;Place;Time;Situation Obsessive Compulsive Thoughts/Behaviors: None  Cognitive Functioning Concentration: Decreased Memory: Recent Intact;Remote Intact IQ: Average Insight: Fair Impulse Control: Fair Appetite:  Fair Weight Loss: 0 Weight Gain: 0 Sleep: Decreased Total Hours of Sleep: 4 Vegetative Symptoms: Decreased grooming;Not bathing  ADLScreening [facility]) Patient's cognitive ability adequate to safely complete daily activities?: Yes Patient able to express need for assistance with ADLs?: Yes Independently performs ADLs?: Yes (appropriate for developmental age)  Prior Inpatient Therapy Prior Inpatient Therapy: Yes Prior Therapy Dates: 04-14-2013 Prior Therapy Facilty/Provider(s): [facility] Reason for Treatment: SI  Prior Outpatient Therapy Prior Outpatient Therapy: No Prior Therapy Dates: NA Prior Therapy Facilty/Provider(s): NA Reason for Treatment: NA  ADL Screening (condition at time of admission) Patient's cognitive ability adequate to safely complete daily activities?: Yes Patient able to express need for assistance with ADLs?: Yes Independently performs ADLs?: Yes (appropriate for developmental age)         Values / Beliefs Cultural Requests During Hospitalization: None Spiritual Requests During Hospitalization: None        Additional Information 1:1 In Past 12 Months?: No CIRT Risk: No Elopement Risk: No Does patient have medical clearance?: Yes     Disposition:  Disposition Initial Assessment Completed for this Encounter: Yes Disposition of Patient: Other dispositions Other disposition(s): Other (Comment)  On Site Evaluation by:   Reviewed with Physician:    Graciella Freer [place] 04/25/2013 4:56 AM

## 2013-04-25 NOTE — Progress Notes (Signed)
Adult Psychoeducational Group Note  Date:  04/25/2013 Time:  9:05 PM  Group Topic/Focus:  Wrap-Up Group:   The focus of this group is to help patients review their daily goal of treatment and discuss progress on daily workbooks.  Participation Level:  Active  Participation Quality:  Appropriate  Affect:  Appropriate  Cognitive:  Appropriate  Insight: Appropriate  Engagement in Group:  Engaged  Modes of Intervention:  Discussion  Additional Comments:  Pt attended wrap-up group this evening and participate with peers   Iretta Mangrum A 04/25/2013, 9:05 PM

## 2013-04-25 NOTE — Progress Notes (Signed)
Hale Bogus at [facility] confirmed receipt of the referral but she has not had an opportunity to review the information.

## 2013-04-25 NOTE — Consult Note (Signed)
  Elijah [name] says he is not suicidal today but at the same time says he cannot contract for safety if discharged.  Feels uneasy about being at home.  He was recently discharged from [facility] and they believe he has an alcohol problem.  He has been accepted to bed307-2 at [facility].

## 2013-04-25 NOTE — Progress Notes (Signed)
Per Patriciaann Clan, PA the patient meets criteria for inpatient hospitalization.  Patient reports that he was discharged from [facility] for SI 10 days ago. Per, Frederico Hamman the patient will be referred back to [facility].    Writer spoke to [employer] at [facility] [phone]).  The patient referral was faxed to [phone]). Writer informed Dr. Marnette Burgess that the patient would be referred back to the hospital that discharged him 10 days ago.

## 2013-04-25 NOTE — Progress Notes (Signed)
Per, Vinnie Level the patient is declined at [facility] due, "to the patients primary being alcohol abuse". The patients disposition will be pending the psych evaluation by the extender in the morning.

## 2013-04-25 NOTE — Progress Notes (Addendum)
Patient ID: DREDEN RIVERE, male   DOB: Jan 28, 1950, 63 y.o.   MRN: [id number]  Patient is a 63yr old male that come to [facility] originally but was transferred to [facility]. He came in c/o depression and SI. He was just discharged from [facility] 2 weeks ago and he was there about 10 days. Medical hx of pancreatitis, hep C, jaundice, arthritis, stomach cancer, and HTN. Hx of seizures was seen in his chart but he adamantly denies a hx of seizures. Patient reports he has visual hallucinations of "shadows" and reports increased depression with SI. Denies ever having a plan. Patient has a long hx of ETOH use. Denies that it is everyday use but does report it is at least 3 or more days per week. Patient says he drinks around a 6 pack but patient appears to minimize usage. Hx of cocaine and THC use but denies any recent use. Patient got permission to have shoe laces for shoes due to past rt hip fracture with pins and trouble with rt foot. He says he can walk a lot better with his shoes. Initially placed him as a high fall due to age, hip issue, and unsteady gait but walking well with shoes. Cooperative with admission. Reports that his cell phone and jeans are missing but security checked and its not at [facility]. Possibly brother took home per patient

## 2013-04-25 NOTE — Progress Notes (Signed)
Writer faxed referral packet to [facility]

## 2013-04-25 NOTE — BHH Counselor (Signed)
Per Nonah Mattes, pt has been accepted to bed 307-2 at [facility]. Writer called and notified TTS Ava at [facility].  Arnold Long, [place] Assessment Counselor

## 2013-04-26 ENCOUNTER — Encounter (HOSPITAL_COMMUNITY): Payer: Self-pay | Admitting: Psychiatry

## 2013-04-26 DIAGNOSIS — R45851 Suicidal ideations: Secondary | ICD-10-CM

## 2013-04-26 DIAGNOSIS — F339 Major depressive disorder, recurrent, unspecified: Secondary | ICD-10-CM

## 2013-04-26 MED ORDER — ENSURE COMPLETE PO LIQD
237.0000 mL | Freq: Three times a day (TID) | ORAL | Status: DC
  Administered 2013-04-26 – 2013-05-01 (×11): 237 mL via ORAL
  Filled 2013-04-26: qty 237

## 2013-04-26 MED ORDER — LORATADINE 10 MG PO TABS
10.0000 mg | ORAL_TABLET | Freq: Every day | ORAL | Status: DC
  Administered 2013-04-26 – 2013-05-01 (×6): 10 mg via ORAL
  Filled 2013-04-26 (×8): qty 1
  Filled 2013-04-26: qty 14

## 2013-04-26 MED ORDER — FLUTICASONE PROPIONATE 50 MCG/ACT NA SUSP
1.0000 | Freq: Every day | NASAL | Status: DC
  Administered 2013-04-26 – 2013-05-01 (×6): 1 via NASAL
  Filled 2013-04-26: qty 16

## 2013-04-26 MED ORDER — BOOST PLUS PO LIQD: 237.0000 mL | Freq: Three times a day (TID) | ORAL | Status: DC

## 2013-04-26 NOTE — Progress Notes (Signed)
NUTRITION ASSESSMENT  Pt identified as at risk on the Malnutrition Screen Tool  INTERVENTION: 1. Educated patient on the importance of nutrition and encouraged intake of food and beverages.  Discussed diet for pancreatitis, provided handout and staff to communicate needs to kitchen.  Patient should follow a low fat diet without etoh to help prevent reoccurrence.  Teach back method used. 2. Discussed weight goals. 3. Supplements:  Boost tid 4.  Chaplain consulted secondary to patient having difficulty dealing with grief.    NUTRITION DIAGNOSIS: Unintentional weight loss related to sub-optimal intake as evidenced by pt report.   Goal: Pt to meet >/= 90% of their estimated nutrition needs.  Monitor:  PO intake  Assessment:  Patient admitted with SI, depression.  Hx of pancreatitis and hepatitis C.  Patient recently admitted for pancreatitis.  Patient reports decreased appetite PTA.  Difficulty dealing with death of his brother.  Receptive to chaplain consult.  Likes Boost. Eating fair-good here.    63 y.o. male  Height: Ht Readings from Last 1 Encounters:  04/25/13 5\' 8"  (1.727 m)    Weight: Wt Readings from Last 1 Encounters:  04/25/13 148 lb (67.132 kg)    Weight Hx: Wt Readings from Last 10 Encounters:  04/25/13 148 lb (67.132 kg)    BMI:  Body mass index is 22.51 kg/(m^2). Pt meets criteria for normal weight based on current BMI.  Estimated Nutritional Needs: Kcal: 25-30 kcal/kg Protein: > 1 gram protein/kg Fluid: 1 ml/kcal  Diet Order: General Pt is also offered choice of unit snacks mid-morning and mid-afternoon.  Pt is eating as desired.   Lab results and medications reviewed.   Antonieta Iba, RD, LDN Clinical Inpatient Dietitian Pager:  [phone] Weekend and after hours pager:  [phone]

## 2013-04-26 NOTE — Progress Notes (Signed)
Patient ID: Elijah [name], male   DOB: 05/27/1950, 63 y.o.   MRN: [id number]  D: Patient has a flat affect on approach today. Reports depression "6" on scale. Reports he doesn't really know about feeling hopeless this am. Reports some tremors this am and some upset stomach. Encouraged to let undersigned know if he needs something for nausea or diarrhea today. Did take a protonix. Patient feels that his pancreatitis is why he has some rt abdominal/side pain. Patient continues on Librium protocol this am.  A: Staff will monitor on q 15 minute checks, follow treatment plan, and give meds as ordered. R: Cooperative at this time.

## 2013-04-26 NOTE — BHH Group Notes (Signed)
[facility] LCSW Aftercare Discharge Planning Group Note   04/26/2013 10:39 AM  Participation Quality:  Appropriate   Mood/Affect:  Appropriate  Depression Rating:  6  Anxiety Rating:  7  Thoughts of Suicide:  No Will you contract for safety?   NA  Current AVH:  No  Plan for Discharge/Comments:  Pt reports that he would like referral to [facility] or [facility]. Pt plans to return home after treatment and followup at [facility] in [place]. He lives alone but identified several family supports.   Transportation Means: family   Supports: brother and nieces.   Smart, [employer]

## 2013-04-26 NOTE — Tx Team (Signed)
Interdisciplinary Treatment Plan Update (Adult)  Date: 04/26/2013   Time Reviewed: 11:41 AM  Progress in Treatment:  Attending groups: Yes  Participating in groups:  Yes  Taking medication as prescribed: Yes  Tolerating medication: Yes  Family/Significant othe contact made: Not yet. SPE required for pt.   Patient understands diagnosis: Yes, AEB seeking treatment for SI, VH, mood stabilization, and ETOH detox.  Discussing patient identified problems/goals with staff: Yes  Medical problems stabilized or resolved: Yes  Denies suicidal/homicidal ideation: Yes during group/self report.  Patient has not harmed self or Others: Yes  New problem(s) identified:  Discharge Plan or Barriers: Pt hoping for inpatient treatment but is deciding on whether or not to return home. (follow up at [facility]). CSW assessing.  Additional comments: Patient is a 63yr old male that come to [facility] originally but was transferred to [facility]. He came in c/o depression and SI. He was just discharged from [facility] 2 weeks ago and he was there about 10 days. Medical hx of pancreatitis, hep C, jaundice, arthritis, stomach cancer, and HTN. Hx of seizures was seen in his chart but he adamantly denies a hx of seizures. Patient reports he has visual hallucinations of "shadows" and reports increased depression with SI. Denies ever having a plan. Patient has a long hx of ETOH use. Denies that it is everyday use but does report it is at least 3 or more days per week. Patient says he drinks around a 6 pack but patient appears to minimize usage. Hx of cocaine and THC use but denies any recent use. Patient got permission to have shoe laces for shoes due to past rt hip fracture with pins and trouble with rt foot. He says he can walk a lot better with his shoes. Initially placed him as a high fall due to age, hip issue, and unsteady gait but walking well with shoes. Cooperative with admission. Reports that his cell phone and jeans are  missing but security checked and its not at [facility]. Possibly brother took home per patient.  Reason for Continuation of Hospitalization: Librium taper-withdrawals Mood stabilization Medication management  Estimated length of stay: 3-5 days  For review of initial/current patient goals, please see plan of care.  Attendees:  Patient:    Family:    Physician: Carlton Adam MD 04/26/2013 11:40 AM   Nursing: Oletta Darter RN   Clinical Social Worker [place], [place]    Other: Britney RN   Other: Gerline Legacy Nurse CM   Other: Jiles Garter Pharmacist    Other: Hardie Pulley. PA   Scribe for Treatment Team:  Nira Conn Smart LCSWA  04/26/2013 11:40 AM

## 2013-04-26 NOTE — Progress Notes (Signed)
D   Pt is irritable and demanding saying if I cant get the medication I  am on then I just want to leave  Pt said he was on mirtazapine but it was not on his home medication list   He said it was locked in his locker but was not listed on his belongings sheet with his other medications    A   Reassured pt he would get a medication to help him sleep and he could discuss with the doctor if the medication doesn't work and the doctor would probably order the mirtazapine   Verbal support given and administered and educated on current medications   Pt on Q 15 min checks R   Pt verbalized understanding but continued to complain about his medications   He remains safe

## 2013-04-26 NOTE — BHH Suicide Risk Assessment (Signed)
Suicide Risk Assessment  Admission Assessment     Nursing information obtained from:  Patient;Review of record Demographic factors:  Male;Divorced or widowed;Low socioeconomic status;Unemployed Current Mental Status:  Self-harm thoughts Loss Factors:  NA Historical Factors:  Prior suicide attempts Risk Reduction Factors:  Positive social support;Positive therapeutic relationship Total Time spent with patient: 1 hour  CLINICAL FACTORS:   Depression:   Comorbid alcohol abuse/dependence Alcohol/Substance Abuse/Dependencies  Psychiatric Specialty Exam:     Blood pressure 132/83, pulse 90, temperature 98.4 F (36.9 C), temperature source Oral, resp. rate 18, height 5\' 8"  (1.727 m), weight 67.132 kg (148 lb).Body mass index is 22.51 kg/(m^2).  General Appearance: Disheveled  Eye Sport and exercise psychologist::  Fair  Speech:  Clear and Coherent  Volume:  Decreased  Mood:  Anxious, Depressed and worried  Affect:  Depressed  Thought Process:  Coherent and Goal Directed  Orientation:  Full (Time, Place, and Person)  Thought Content:  symptoms, worries, concerns, loss of brother  Suicidal Thoughts:  No  Homicidal Thoughts:  No  Memory:  Immediate;   Fair Recent;   Fair Remote;   Fair  Judgement:  Fair  Insight:  Present and Shallow  Psychomotor Activity:  Restlessness  Concentration:  Fair  Recall:  [employer] of Knowledge:NA  Language: Fair  Akathisia:  No  Handed:    AIMS (if indicated):     Assets:  Desire for Improvement  Sleep:  Number of Hours: 5.75   Musculoskeletal: Strength & Muscle Tone: decreased Gait & Station: unsteady Patient leans: N/A  COGNITIVE FEATURES THAT CONTRIBUTE TO RISK:  Closed-mindedness Polarized thinking Thought constriction (tunnel vision)    SUICIDE RISK:   Moderate:  Frequent suicidal ideation with limited intensity, and duration, some specificity in terms of plans, no associated intent, good self-control, limited dysphoria/symptomatology, some risk factors  present, and identifiable protective factors, including available and accessible social support.  PLAN OF CARE: Supportive approach/coping skills/relapse prevention                               Detox/reassess and address the co morbidities  I certify that inpatient services furnished can reasonably be expected to improve the patient's condition.  Shaquelle Hernon A 04/26/2013, 5:45 PM

## 2013-04-26 NOTE — H&P (Signed)
Psychiatric Admission Assessment Adult  Patient Identification:  Elijah [name]  Date of Evaluation:  04/26/2013  Chief Complaint:  Alcohol Dependence and MDD   History of Present Illness: Elijah [name] is 50, African-American male. He reports, "My brother took me to the hospital. I have nerve problems. I was just feeling up to no good. I was thinking about hurting myself. It's been going off & on x 3 weeks, right after my brother's death. He just got ill, then passed. I was depressed prior to his death, but his passing worsened my depression. I did not attempt suicide this time, I had in the past, about 5 years ago. I took an overdose of pills and drank a bunch of liquor. I have been drinking quite a bit, 6 cans of beer and a bottle of wine daily x 3 weeks. I'm an alcoholic. Longest sobriety 18 months. Was at [facility] 5 years ago. I go to [facility] clinic on an outpatient basis. I have bad headaches, anxious and depressed quite a bit".  Elements:  Location:  Alcoholism, suicidal ideations. Quality:  Worsening depression, increased alcohol consumption, suicidal ideations. Severity:  Severe. Timing:  Constant suicide thoughts and increased alcohol consumption x 3 weeks. Duration:  chronic alcoholism. Context:  Brother died a little over 3 weeks ago, worsened his depression, drank a lot, develop SI.  Associated Signs/Synptoms:  Depression Symptoms:  depressed mood, feelings of worthlessness/guilt, hopelessness, anxiety, insomnia, loss of energy/fatigue,  (Hypo) Manic Symptoms:  Hallucinations, Impulsivity,  Anxiety Symptoms:  Excessive Worry,  Psychotic Symptoms:  Hallucinations: Visual  PTSD Symptoms: Had a traumatic exposure:  Denies  Total Time spent with patient: 45 minutes  Psychiatric Specialty Exam: Physical Exam  Constitutional: He is oriented to person, place, and time. He appears well-developed.  HENT:  Head: Normocephalic.  Eyes: Pupils are equal, round, and reactive to light.   Neck: Normal range of motion.  Cardiovascular: Normal rate.   Respiratory: Effort normal.  Genitourinary:  Denies any issues in this area  Musculoskeletal: Normal range of motion.  Weak gait and balance  Neurological: He is alert and oriented to person, place, and time.  Skin: Skin is warm and dry.  Vitilago  Psychiatric: Thought content normal. His mood appears anxious. He is actively hallucinating ("I see shadows"). Cognition and memory are normal. He expresses impulsivity. He exhibits a depressed mood.    Review of Systems  Constitutional: Positive for malaise/fatigue.  Eyes: Negative.   Respiratory: Negative.   Cardiovascular: Negative.   Gastrointestinal: Negative.   Genitourinary: Negative.   Musculoskeletal: Positive for joint pain and myalgias.  Skin: Negative.        Skin disease: Vitilago  Neurological: Positive for dizziness, tremors, weakness and headaches.  Endo/Heme/Allergies: Negative.   Psychiatric/Behavioral: Positive for depression, hallucinations ("I see Shadows"), memory loss and substance abuse (Alcoholism). Negative for suicidal ideas. The patient is nervous/anxious and has insomnia.     Blood pressure 141/93, pulse 74, temperature 98.4 F (36.9 C), temperature source Oral, resp. rate 18, height '5\' 8"'  (1.727 m), weight 67.132 kg (148 lb).Body mass index is 22.51 kg/(m^2).  General Appearance: Casual and skin discoloration (Vitilago)  Eye Contact::  Fair  Speech:  Clear and Coherent  Volume:  Normal  Mood:  Anxious and Depressed  Affect:  Congruent and Flat  Thought Process:  Coherent and Goal Directed  Orientation:  Full (Time, Place, and Person)  Thought Content:  Hallucinations: "I see shadows" and Rumination  Suicidal Thoughts:  No  Homicidal Thoughts:  No  Memory:  Immediate;   Good Recent;   Good Remote;   Good  Judgement:  Impaired  Insight:  Fair  Psychomotor Activity:  Tremor  Concentration:  Fair  Recall:  [employer] of Knowledge:Poor   Language: Fair  Akathisia:  No  Handed:  Right  AIMS (if indicated):     Assets:  Desire for Improvement  Sleep:  Number of Hours: 5.75    Musculoskeletal: Strength & Muscle Tone: within normal limits Gait & Station: unsteady Patient leans: NA  Past Psychiatric History: Diagnosis: Alcohol dependence, Major depression, recurrent  Hospitalizations: [facility] adult unit  Outpatient Care: [facility]  Substance Abuse Care: [facility]  Self-Mutilation: Denies  Suicidal Attempts: "yes, 5 years ago by overdose"  Violent Behaviors: Denies   Past Medical History:   Past Medical History  Diagnosis Date  . Pancreatitis   . Hypertension   . Arthritis   . Coronary artery disease   . Hepatitis C   . Shortness of breath   . Depression   . Cancer     stomach   Cardiac History:  SOB, HTN, CAD  Allergies:   Allergies  Allergen Reactions  . Aspirin   . Contrast Media [Iodinated Diagnostic Agents]   . Ibuprofen    PTA Medications: Prescriptions prior to admission  Medication Sig Dispense Refill  . pantoprazole (PROTONIX) 40 MG tablet Take 40 mg by mouth daily.      Marland Kitchen PARoxetine (PAXIL) 20 MG tablet Take 20 mg by mouth daily.      Marland Kitchen thiamine 100 MG tablet Take 100 mg by mouth daily.      . traMADol (ULTRAM) 50 MG tablet Take 50 mg by mouth every 6 (six) hours as needed for moderate pain.      . traZODone (DESYREL) 100 MG tablet Take 100 mg by mouth at bedtime.        Previous Psychotropic Medications:  Medication/Dose  See medication lists above               Substance Abuse History in the last 12 months:  yes  Consequences of Substance Abuse: Medical Consequences:  Liver damage, Possible death by overdose Legal Consequences:  Arrests, jail time, Loss of driving privilege. Family Consequences:  Family discord, divorce and or separation.  Social History:  reports that he quit smoking 4 days ago. His smoking use included Cigarettes. He smoked 0.00 packs per day. He  does not have any smokeless tobacco history on file. He reports that he drinks alcohol. He reports that he uses illicit drugs (Cocaine and Marijuana). Additional Social History: Pain Medications: none History of alcohol / drug use?: Yes Longest period of sobriety (when/how long): 1/1/2 years Negative Consequences of Use: Financial;Legal Withdrawal Symptoms: Sweats;Tremors;Weakness Name of Substance 1: ETOH 1 - Age of First Use: teens 1 - Amount (size/oz): varies 1 - Frequency: over 3 days per week 1 - Duration: many years 1 - Last Use / Amount: few beers  Current Place of Residence:  [place], [place] of Birth:  [place], [place]  Family Members: "My daughter"  Marital Status:  Divorced  Children: 1  Sons: 0  Daughters: 1  Relationships: Divorced  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  Legal History: None pending  Hobbies/Interests: NA  Family History:  History reviewed. No pertinent family history.  Results for orders  placed during the hospital encounter of 04/24/13 (from the past 72 hour(s))  ACETAMINOPHEN LEVEL     Status: None   Collection Time    04/24/13  3:03 PM      Result Value Ref Range   Acetaminophen (Tylenol), Serum <15.0  10 - 30 ug/mL   Comment:            THERAPEUTIC CONCENTRATIONS VARY     SIGNIFICANTLY. A RANGE OF 10-30     ug/mL MAY BE AN EFFECTIVE     CONCENTRATION FOR MANY PATIENTS.     HOWEVER, SOME ARE BEST TREATED     AT CONCENTRATIONS OUTSIDE THIS     RANGE.     ACETAMINOPHEN CONCENTRATIONS     >150 ug/mL AT 4 HOURS AFTER     INGESTION AND >50 ug/mL AT 12     HOURS AFTER INGESTION ARE     OFTEN ASSOCIATED WITH TOXIC     REACTIONS.  CBC     Status: Abnormal   Collection Time    04/24/13  3:03 PM      Result Value Ref Range   WBC 6.7  4.0  - 10.5 K/uL   RBC 4.43  4.22 - 5.81 MIL/uL   Hemoglobin 12.0 (*) 13.0 - 17.0 g/dL   HCT 35.0 (*) 39.0 - 52.0 %   MCV 79.0  78.0 - 100.0 fL   MCH 27.1  26.0 - 34.0 pg   MCHC 34.3  30.0 - 36.0 g/dL   RDW 15.0  11.5 - 15.5 %   Platelets 309  150 - 400 K/uL  COMPREHENSIVE METABOLIC PANEL     Status: Abnormal   Collection Time    04/24/13  3:03 PM      Result Value Ref Range   Sodium 141  137 - 147 mEq/L   Potassium 4.1  3.7 - 5.3 mEq/L   Chloride 105  96 - 112 mEq/L   CO2 20  19 - 32 mEq/L   Glucose, Bld 78  70 - 99 mg/dL   BUN 16  6 - 23 mg/dL   Creatinine, Ser 1.01  0.50 - 1.35 mg/dL   Calcium 8.4  8.4 - 10.5 mg/dL   Total Protein 7.6  6.0 - 8.3 g/dL   Albumin 4.1  3.5 - 5.2 g/dL   AST 37  0 - 37 U/L   ALT 41  0 - 53 U/L   Alkaline Phosphatase 65  39 - 117 U/L   Total Bilirubin 0.8  0.3 - 1.2 mg/dL   GFR calc non Af Amer 77 (*) >90 mL/min   GFR calc Af Amer 89 (*) >90 mL/min   Comment: (NOTE)     The eGFR has been calculated using the CKD EPI equation.     This calculation has not been validated in all clinical situations.     eGFR's persistently <90 mL/min signify possible Chronic Kidney     Disease.  ETHANOL     Status: Abnormal   Collection Time    04/24/13  3:03 PM      Result Value Ref Range   Alcohol, Ethyl (B) 100 (*) 0 - 11 mg/dL   Comment:            LOWEST DETECTABLE LIMIT FOR     SERUM ALCOHOL IS 11 mg/dL     FOR MEDICAL PURPOSES ONLY  SALICYLATE LEVEL     Status: Abnormal   Collection Time    04/24/13  3:03 PM  Result Value Ref Range   Salicylate Lvl <4.4 (*) 2.8 - 20.0 mg/dL  LIPASE, BLOOD     Status: None   Collection Time    04/24/13  3:27 PM      Result Value Ref Range   Lipase 24  11 - 59 U/L  URINE RAPID DRUG SCREEN (HOSP PERFORMED)     Status: None   Collection Time    04/24/13  4:37 PM      Result Value Ref Range   Opiates NONE DETECTED  NONE DETECTED   Cocaine NONE DETECTED  NONE DETECTED   Benzodiazepines NONE DETECTED  NONE DETECTED    Amphetamines NONE DETECTED  NONE DETECTED   Tetrahydrocannabinol NONE DETECTED  NONE DETECTED   Barbiturates NONE DETECTED  NONE DETECTED   Comment:            DRUG SCREEN FOR MEDICAL PURPOSES     ONLY.  IF CONFIRMATION IS NEEDED     FOR ANY PURPOSE, NOTIFY LAB     WITHIN 5 DAYS.                LOWEST DETECTABLE LIMITS     FOR URINE DRUG SCREEN     Drug Class       Cutoff (ng/mL)     Amphetamine      1000     Barbiturate      200     Benzodiazepine   967     Tricyclics       591     Opiates          300     Cocaine          300     THC              50   Psychological Evaluations:  Assessment:   DSM5: Schizophrenia Disorders:  NA Obsessive-Compulsive Disorders:  NA Trauma-Stressor Disorders:  NA Substance/Addictive Disorders:  Alcohol Related Disorder - Severe (303.90) Depressive Disorders:  Major Depressive Disorder - Severe (296.23)  AXIS I:  Alcohol Related Disorder - Severe (303.90), Major depressive disorder, recurrent AXIS II:  Deferred AXIS III:   Past Medical History  Diagnosis Date  . Pancreatitis   . Hypertension   . Arthritis   . Coronary artery disease   . Hepatitis C   . Shortness of breath   . Depression   . Cancer     stomach   AXIS IV:  other psychosocial or environmental problems and Alcoholism, chronic AXIS V:  41-50 serious symptoms  Treatment Plan/Recommendations: 1. Admit for crisis management and stabilization, estimated length of stay 3-5 days.  2. Medication management to reduce current symptoms to base line and improve the patient's overall level of functioning; Continue librium detox protocol in progress.  3. Treat health problems as indicated.  4. Develop treatment plan to decrease risk of relapse upon discharge and the need for readmission.  5. Psycho-social education regarding relapse prevention and self care.  6. Health care follow up as needed for medical problems; Initiate Claritin 10 mg daily, Flonase spray daily for allergies.    7. Review, reconcile, and reinstate any pertinent home medications for other health issues where appropriate. 8. Call for consults with hospitalist for any additional specialty patient care services as needed.  Treatment Plan Summary: Daily contact with patient to assess and evaluate symptoms and progress in treatment Medication management  Current Medications:  Current Facility-Administered Medications  Medication Dose Route Frequency Provider Last Rate  Last Dose  . acetaminophen (TYLENOL) tablet 650 mg  650 mg Oral Q6H PRN Encarnacion Slates, NP      . alum & mag hydroxide-simeth (MAALOX/MYLANTA) 200-200-20 MG/5ML suspension 30 mL  30 mL Oral Q4H PRN Encarnacion Slates, NP      . chlordiazePOXIDE (LIBRIUM) capsule 25 mg  25 mg Oral Q6H PRN Encarnacion Slates, NP      . chlordiazePOXIDE (LIBRIUM) capsule 25 mg  25 mg Oral Once Encarnacion Slates, NP      . chlordiazePOXIDE (LIBRIUM) capsule 25 mg  25 mg Oral QID Encarnacion Slates, NP   25 mg at 04/26/13 8185   Followed by  . chlordiazePOXIDE (LIBRIUM) capsule 25 mg  25 mg Oral TID Encarnacion Slates, NP       Followed by  . [START ON 04/27/2013] chlordiazePOXIDE (LIBRIUM) capsule 25 mg  25 mg Oral BH-qamhs Encarnacion Slates, NP       Followed by  . [START ON 04/29/2013] chlordiazePOXIDE (LIBRIUM) capsule 25 mg  25 mg Oral Daily Encarnacion Slates, NP      . hydrOXYzine (ATARAX/VISTARIL) tablet 25 mg  25 mg Oral Q6H PRN Encarnacion Slates, NP   25 mg at 04/25/13 2142  . lisinopril (PRINIVIL,ZESTRIL) tablet 10 mg  10 mg Oral Daily Encarnacion Slates, NP   10 mg at 04/26/13 0829  . loperamide (IMODIUM) capsule 2-4 mg  2-4 mg Oral PRN Encarnacion Slates, NP      . magnesium hydroxide (MILK OF MAGNESIA) suspension 30 mL  30 mL Oral Daily PRN Encarnacion Slates, NP      . multivitamin with minerals tablet 1 tablet  1 tablet Oral Daily Encarnacion Slates, NP   1 tablet at 04/26/13 0829  . nicotine (NICODERM CQ - dosed in mg/24 hours) patch 21 mg  21 mg Transdermal Q0600 Encarnacion Slates, NP      . ondansetron  (ZOFRAN-ODT) disintegrating tablet 4 mg  4 mg Oral Q6H PRN Encarnacion Slates, NP      . pantoprazole (PROTONIX) EC tablet 40 mg  40 mg Oral Daily Encarnacion Slates, NP   40 mg at 04/26/13 0829  . PARoxetine (PAXIL) tablet 20 mg  20 mg Oral Daily Encarnacion Slates, NP   20 mg at 04/26/13 0829  . thiamine (B-1) injection 100 mg  100 mg Intramuscular Once Encarnacion Slates, NP      . thiamine (VITAMIN B-1) tablet 100 mg  100 mg Oral Daily Encarnacion Slates, NP   100 mg at 04/26/13 6314  . traZODone (DESYREL) tablet 100 mg  100 mg Oral QHS Encarnacion Slates, NP   100 mg at 04/25/13 2142    Observation Level/Precautions:  15 minute checks  Laboratory:  Per ED  Psychotherapy:  Group sessions, AA/NA meetings  Medications:  See current med lists  Consultations:  As needed  Discharge Concerns:  Maintaining sobriety  Estimated LOS: 2-4 days  Other:     I certify that inpatient services furnished can reasonably be expected to improve the patient's condition.   Encarnacion Slates, PMHNP-BC 4/3/20159:29 AM Personally evaluated the patient, reviewed the physical exam and labs and agree with assessment and plan Geralyn Flash A. Sabra Heck, M.D.

## 2013-04-26 NOTE — BHH Group Notes (Signed)
[facility] LCSW Group Therapy  04/26/2013 4:24 PM  Type of Therapy:  Group Therapy  Participation Level:  Did Not Attend-pt with CSW Vidal Schwalbe) completing PSA/did not attend afternoon group.    Smart, Orlanda Lemmerman LCSWA  04/26/2013, 4:24 PM

## 2013-04-26 NOTE — [facility] Counselor (Signed)
Adult Comprehensive Assessment  Patient ID: Elijah [name], male   DOB: 03/15/1950, 63 y.o.   MRN: [id number]  Information Source: Information source: Patient  Current Stressors:  Substance abuse: increased substance abuse after death of his brother Bereavement / Loss: patient's oldest brother passed away about 5 months ago. patient reports he is still greiving  Living/Environment/Situation:  Living Arrangements: Alone Living conditions (as described by patient or guardian): patient lives in his own home with a roommate/friend whom he allows to stay there. He also has his brother's dog whom he cares for.  Patient reports he can return home with no barriers.  All basic needs met. How long has patient lived in current situation?: past 10 years What is atmosphere in current home: Supportive;Loving  Family History:  Marital status: Divorced Divorced, when?: 15 years What types of issues is patient dealing with in the relationship?: reports they are better of close friends than as a married couple Additional relationship information: patient was also in a long term common law marriage. reports they reattempted to get married, however stayed friends (currently patient is not in a relationship) Does patient have children?: Yes How many children?: 1 How is patient's relationship with their children?: Very close and also close with his grandchildren whom he reports he wants to be around to mentor and enjoy their lives  Childhood History:  By whom was/is the patient raised?: Both parents Description of patient's relationship with caregiver when they were a child: patient was very close with mother after his father passed away when he was 84 years old Patient's description of current relationship with people who raised him/her: both parents are deceased. patient still misses his mother reporting it tore him apart when she passed away Does patient have siblings?: Yes Number of Siblings:  9 Description of patient's current relationship with siblings: reports only 4 siblings left .out of 9 (7 boys, 2 girls).  reports he remains close with all brothers. Did patient suffer any verbal/emotional/physical/sexual abuse as a child?: No Did patient suffer from severe childhood neglect?: No Has patient ever been sexually abused/assaulted/raped as an adolescent or adult?: No Was the patient ever a victim of a crime or a disaster?: No Witnessed domestic violence?: No Has patient been effected by domestic violence as an adult?: No  Education:  Highest grade of school patient has completed: 1 year of college (radiology) but did not complete Currently a student?: No Name of school: NA Learning disability?: No  Employment/Work Situation:   Employment situation:  (retired) Patient's job has been impacted by current illness: No What is the longest time patient has a held a job?: patient was working with heavy Librarian, 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 reports as he has aged he is not using as heavy of drugs but currently drinking most weeks (will stop when he feels sick) beer and wine.  Patient will smoke cocaine 2 times a month If attempted suicide, did drugs/alcohol play a role in this?: No Alcohol/Substance Abuse Treatment Hx: Past Tx, Inpatient;Past Tx, Outpatient If yes, describe treatment: patient has been to [facility] in the  past and [facility] outpatient Has alcohol/substance abuse ever caused legal problems?: No  Social Support System:   Patient's Community Support  System: Good Describe Community Support System: many famly supports Type of faith/religion: Darrick Meigs How does patient's faith help to cope with current illness?: pray, faith, and reading bible.    Leisure/Recreation:   Leisure and Hobbies: patient reports he likes to tell his story and mentor  Strengths/Needs:   What things does the patient do well?: reports he enjoys helping others In what areas does patient struggle / problems for patient: makes too many decisions based off of emotion rather than logic or with his head  Discharge Plan:   Does patient have access to transportation?: Yes Will patient be returning to same living situation after discharge?: Yes Currently receiving community mental health services: Yes (From Whom) (in the past has been to [facility]) If no, would patient like referral for services when discharged?: Yes (What county?) (would like [facility] long term SA) Does patient have financial barriers related to discharge medications?: No  Summary/Recommendations:      Elijah [name] is a 63 year old male with a past history of substance abuse and physical problems.  Elijah [name] has increased his alcohol intake due to the death of a brother and wanting to get sober so he can physically be able to enjoy his life and life with his grandchildren. He reports increased depression and reports his SA use increases when he is not working. 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  Patient is actively seeking safety and substance abuse resources. Admitted to acute crisis unit for detox and programming. Patient will participate in detox protocol and adult programming.    Elijah [name] 04/26/2013

## 2013-04-27 DIAGNOSIS — Z634 Disappearance and death of family member: Secondary | ICD-10-CM

## 2013-04-27 DIAGNOSIS — F1994 Other psychoactive substance use, unspecified with psychoactive substance-induced mood disorder: Secondary | ICD-10-CM

## 2013-04-27 LAB — URINALYSIS, ROUTINE W REFLEX MICROSCOPIC
BILIRUBIN URINE: NEGATIVE
Glucose, UA: NEGATIVE mg/dL
HGB URINE DIPSTICK: NEGATIVE
Ketones, ur: NEGATIVE mg/dL
Leukocytes, UA: NEGATIVE
Nitrite: NEGATIVE
PROTEIN: NEGATIVE mg/dL
Specific Gravity, Urine: 1.019 (ref 1.005–1.030)
UROBILINOGEN UA: 0.2 mg/dL (ref 0.0–1.0)
pH: 5.5 (ref 5.0–8.0)

## 2013-04-27 NOTE — Progress Notes (Signed)
[facility] Group Notes:  (Nursing/MHT/Case Management/Adjunct)  Date:  04/27/2013  Time:  6:48 PM  Type of Therapy:  Psychoeducational Skills  Participation Level:  Did Not Attend  Participation Quality:    Affect:    Cognitive:    Insight:    Engagement in Group:    Modes of Intervention:    Summary of Progress/Problems: Patient did not attend group.  Tonia Brooms D 04/27/2013, 6:48 PM

## 2013-04-27 NOTE — Progress Notes (Signed)
[place] Group Notes:  (Nursing/MHT/Case Management/Adjunct)  Date:  04/27/2013  Time:  6:54 PM  Type of Therapy:  Psychoeducational Skills  Participation Level:  Did Not Attend  Participation Quality:    Affect:    Cognitive:    Insight:    Engagement in Group:    Modes of Intervention:    Summary of Progress/Problems:  Patient did not attend group.  Tonia Brooms D 04/27/2013, 6:54 PM

## 2013-04-27 NOTE — Progress Notes (Signed)
[facility] MD Progress Note  04/27/2013 6:21 PM MAUI BRITTEN  MRN:  [id number] Subjective: Olufemi states that he is trying to get his life back together. States that the death of his brother really affected him. He was his "baby brother" and they were very closed. He asked his wife to leave his Lucianne Lei to him. States he sees the [facility] and cant stop crying. He states he used alcohol to deal with the pain but he cant be doing than anymroe Diagnosis:   DSM5: Schizophrenia Disorders:  none Obsessive-Compulsive Disorders:  none Trauma-Stressor Disorders:  none Substance/Addictive Disorders:  Alcohol Related Disorder - Severe (303.90) Depressive Disorders:  Major Depressive Disorder - Moderate (296.22) Total Time spent with patient: 30 minutes  Axis I: Bereavement and Substance Induced Mood Disorder  ADL's:  Intact  Sleep: Fair  Appetite:  Fair  Suicidal Ideation:  Plan:  denies Intent:  denies Means:  denies Homicidal Ideation:  Plan:  denies Intent:  denies Means:  denies AEB (as evidenced by):  Psychiatric Specialty Exam: Physical Exam  Review of Systems  Constitutional: Positive for malaise/fatigue.  HENT: Negative.   Eyes: Negative.   Respiratory: Negative.   Cardiovascular: Negative.   Gastrointestinal: Negative.   Genitourinary: Negative.   Musculoskeletal: Positive for joint pain.  Skin: Negative.   Neurological: Positive for weakness.  Endo/Heme/Allergies: Negative.   Psychiatric/Behavioral: Positive for depression and substance abuse. The patient is nervous/anxious.     Blood pressure 140/86, pulse 79, temperature 97.5 F (36.4 C), temperature source Oral, resp. rate 16, height 5\' 8"  (1.727 m), weight 67.132 kg (148 lb).Body mass index is 22.51 kg/(m^2).  General Appearance: Fairly Groomed  Engineer, water::  Fair  Speech:  Clear and Coherent  Volume:  Decreased  Mood:  Anxious and Depressed  Affect:  Depressed and Tearful  Thought Process:  Coherent and Goal Directed   Orientation:  Full (Time, Place, and Person)  Thought Content:  symptoms, worries, concerns, grief  Suicidal Thoughts:  No  Homicidal Thoughts:  No  Memory:  Immediate;   Fair Recent;   Fair Remote;   Fair  Judgement:  Fair  Insight:  Present  Psychomotor Activity:  Restlessness  Concentration:  Fair  Recall:  [employer] of Knowledge:NA  Language: Fair  Akathisia:  No  Handed:    AIMS (if indicated):     Assets:  Desire for Improvement  Sleep:  Number of Hours: 6.75   Musculoskeletal: Strength & Muscle Tone: within normal limits Gait & Station: unsteady Patient leans: N/A  Current Medications: Current Facility-Administered Medications  Medication Dose Route Frequency Provider Last Rate Last Dose  . acetaminophen (TYLENOL) tablet 650 mg  650 mg Oral Q6H PRN Encarnacion Slates, NP   650 mg at 04/27/13 0750  . alum & mag hydroxide-simeth (MAALOX/MYLANTA) 200-200-20 MG/5ML suspension 30 mL  30 mL Oral Q4H PRN Encarnacion Slates, NP      . chlordiazePOXIDE (LIBRIUM) capsule 25 mg  25 mg Oral Q6H PRN Encarnacion Slates, NP      . chlordiazePOXIDE (LIBRIUM) capsule 25 mg  25 mg Oral Once Encarnacion Slates, NP      . chlordiazePOXIDE (LIBRIUM) capsule 25 mg  25 mg Oral BH-qamhs Encarnacion Slates, NP       Followed by  . [START ON 04/29/2013] chlordiazePOXIDE (LIBRIUM) capsule 25 mg  25 mg Oral Daily Encarnacion Slates, NP      . feeding supplement (ENSURE COMPLETE) (ENSURE COMPLETE) liquid 237 mL  237 mL Oral TID WC Nicholaus Bloom, MD   237 mL at 04/27/13 1134  . fluticasone (FLONASE) 50 MCG/ACT nasal spray 1 spray  1 spray Each Nare Daily Encarnacion Slates, NP   1 spray at 04/27/13 0751  . hydrOXYzine (ATARAX/VISTARIL) tablet 25 mg  25 mg Oral Q6H PRN Encarnacion Slates, NP   25 mg at 04/25/13 2142  . lisinopril (PRINIVIL,ZESTRIL) tablet 10 mg  10 mg Oral Daily Encarnacion Slates, NP   10 mg at 04/27/13 0750  . loperamide (IMODIUM) capsule 2-4 mg  2-4 mg Oral PRN Encarnacion Slates, NP      . loratadine (CLARITIN) tablet 10 mg  10  mg Oral Daily Encarnacion Slates, NP   10 mg at 04/27/13 0750  . magnesium hydroxide (MILK OF MAGNESIA) suspension 30 mL  30 mL Oral Daily PRN Encarnacion Slates, NP      . multivitamin with minerals tablet 1 tablet  1 tablet Oral Daily Encarnacion Slates, NP   1 tablet at 04/27/13 0750  . nicotine (NICODERM CQ - dosed in mg/24 hours) patch 21 mg  21 mg Transdermal Q0600 Encarnacion Slates, NP      . ondansetron (ZOFRAN-ODT) disintegrating tablet 4 mg  4 mg Oral Q6H PRN Encarnacion Slates, NP      . pantoprazole (PROTONIX) EC tablet 40 mg  40 mg Oral Daily Encarnacion Slates, NP   40 mg at 04/27/13 0750  . PARoxetine (PAXIL) tablet 20 mg  20 mg Oral Daily Encarnacion Slates, NP   20 mg at 04/27/13 0751  . thiamine (B-1) injection 100 mg  100 mg Intramuscular Once Encarnacion Slates, NP      . thiamine (VITAMIN B-1) tablet 100 mg  100 mg Oral Daily Encarnacion Slates, NP   100 mg at 04/27/13 0750  . traZODone (DESYREL) tablet 100 mg  100 mg Oral QHS Encarnacion Slates, NP   100 mg at 04/26/13 2146    Lab Results: No results found for this or any previous visit (from the past 48 hour(s)).  Physical Findings: AIMS: Facial and Oral Movements Muscles of Facial Expression: None, normal Lips and Perioral Area: None, normal Jaw: None, normal Tongue: None, normal,Extremity Movements Upper (arms, wrists, hands, fingers): None, normal Lower (legs, knees, ankles, toes): None, normal, Trunk Movements Neck, shoulders, hips: None, normal, Overall Severity Severity of abnormal movements (highest score from questions above): None, normal Incapacitation due to abnormal movements: None, normal Patient's awareness of abnormal movements (rate only patient's report): No Awareness, Dental Status Current problems with teeth and/or dentures?: Yes Does patient usually wear dentures?: No  CIWA:  CIWA-Ar Total: 0 COWS:     Treatment Plan Summary: Daily contact with patient to assess and evaluate symptoms and progress in treatment Medication management  Plan:  Supportive approach/coping skills/relapse prevention           Continue Librium Detox           Optimize treatment with psychotropics           Grief/loss  Medical Decision Making Problem Points:  Review of psycho-social stressors (1) Data Points:  Review of medication regiment & side effects (2)  I certify that inpatient services furnished can reasonably be expected to improve the patient's condition.   [place] A 04/27/2013, 6:21 PM

## 2013-04-27 NOTE — B[facility] Group Notes (Signed)
[facility] LCSW Group Therapy  04/27/2013 12:44 PM  Type of Therapy:  Group Therapy  Participation Level:  Active  Participation Quality:  Appropriate, Attentive, Sharing and Supportive  Affect:  Appropriate  Cognitive:  Alert, Appropriate and Oriented  Insight:  Developing/Improving, Engaged and Supportive  Engagement in Therapy:  Developing/Improving, Engaged and Supportive  Modes of Intervention:  Discussion, Education, Exploration, Rapport Building, Socialization and Support  Summary of Progress/Problems: Pt way very engaged and supportive during group.  Pt was able to share personal experiences and be supportive of other group participants.  Pt was able to identify positive and negative supports, as well as a coping skill that is affective.    Katha Hamming 04/27/2013, 12:44 PM

## 2013-04-27 NOTE — Progress Notes (Signed)
Patient did not attend the evening speaker [place] meeting.  Pt remained in bed during group time. Pt reported, " Im not feeling well, I think its my nerves".

## 2013-04-27 NOTE — Progress Notes (Signed)
D: Pt asleep. No distress noted. Resp are even and unlabored.  A: 15 minute checks performed for safety.  R: Pt safety maintained at this time.

## 2013-04-27 NOTE — Progress Notes (Signed)
D   Pt is depressed and sad  He can be irritable at times but is cooperative    He tends to isolate and he does not attend all groups   He denies suicidal ideation and contracts for safety on the unit A   Verbal support given   Medications administered and effectiveness monitored   Q 15 min checks R   Pt safe at present

## 2013-04-27 NOTE — Progress Notes (Signed)
Pt attended AA group this evening.  

## 2013-04-27 NOTE — B[facility] Group Notes (Signed)
[facility] Group Notes:  (Nursing/MHT/Case Management/Adjunct)  Date:  04/27/2013  Time:  2:34 PM  Type of Therapy:  Psychoeducational Skills  Participation Level:  Did Not Attend  Summary of Progress/Problems: Pt did not attend video and discussion on how behavior effects addiction.  Orlin Hilding 04/27/2013, 2:34 PM

## 2013-04-28 NOTE — Progress Notes (Signed)
Adult Psychoeducational Group Note  Date:  04/28/2013 Time:  9:49 AM  Group Topic/Focus:  Identifying Needs:   The focus of this group is to help patients identify their personal needs that have been historically problematic and identify healthy behaviors to address their needs.  Participation Level:  Did Not Attend  Participation Quality:  did not attend  Affect:  did not attend  Cognitive:  did not attend  Insight: None  Engagement in Group:  did not attend  Modes of Intervention:  Discussion and Education  Additional Comments:  Staff did encourage [place] to attend group, he elected to remain in bed.  Marja Kays 04/28/2013, 9:49 AM

## 2013-04-28 NOTE — Progress Notes (Signed)
Patient did attend the evening speaker AA meeting.  

## 2013-04-28 NOTE — Progress Notes (Signed)
Elijah [name] speaks of the recent death of a brother. They were of a family of nine siblings, seven sons and two daughters, where were close in relationship. Many of his brothers became [employer], he did not. Elijah [name] and the brother that died were especially close. He is suffering from regret that instead of being with his brother he stopped and started drinking alcohol. He regrets not being sober and being the person he wished to be when his family needed him. This experience reminds him of time when a close friend was dying and instead of going to see him, Elijah [name] stopped along the way for a drink and never made it to see his friend before the friend died. He speaks of his feels of shame at not being able to complete his journeys to be with those close to him before they die. It is unclear if the brother that died is the first of the nine siblings to die.  Elijah [name] states he can't let go of the shame he feels. The shame is eating at him and that he states is why he is in [facility]. When asked what it would take to release this shame, he stated that he would have to fill his time with something that brings him peace, something that he feels fulfilled at doing. Exploring the possibilities he came across two things that bring him joy, and that allows him to release his negative feelings about himself. The first is working, specifically having a paint brush in his hand, listening to "uplifting" music. The second is playing a game of chess, which whether he wins or loses, he feels he has used his mind for positive things rather than being caught by the negative thoughts he has when alone and unoccupied.  Elijah [name] admits in the past he has abused his prescription drugs by taking more than the correct dose, and for drinking alcohol to excess to escape his shame and fears.  Although relatively young at age 63, Elijah [name] exhibits many of the classic spiritual and emotional signs of one that is advanced in age.  He can not work as hard as he used to when he was younger, and his years of memories of doing things he now regrets give him pause to think of the things he planned to do, but did not do. He stated several times in our conversations about things he and his brother planned to do together but now can not do. For him, he states, time is running too fast ahead.   We spoke of slowing the rapid pace of life down a bit by relaxing into those things that he can do that will bring him joy. These include finding a partner to play chess, listening to "uplifting" music even if he has to imagine the brush strokes, and accepting the assistance of the staff of the hospital to give him medical, mental and spiritual support. The last he acknowledged was something that brings peace through knowing the staff is working for his best holistic health and well being.   Elijah [name] the chaplain for coming and listening. He requested prayer and together we prayed for those things that will bring him peace and joy, things not related to alcohol or drugs.  Questions to explore in later Spiritual Care follow ups: (1)  Is his inability to be with a person that is dying a fear of seeing a loved one dying or is it a fear of his own  death that appears coming faster because time is passing more rapidly. (2)  Being a person who likes being around other people, how can he deal more positively toward those time when he is alone. (3)  What is there within his ability he can do to feel useful and a contributor to his world?  Recommend follow up visits by the [facility] lead chaplain to provide a fuller spiritual and emotional assessment of Elijah [name].  Sallee Lange. Annaliz Aven, DMin Chaplain.

## 2013-04-28 NOTE — Progress Notes (Signed)
[facility] MD Progress Note  04/28/2013 4:09 PM MARX DOIG  MRN:  [id number] Subjective:  Elijah [name] is having a "bad day today." states that he understands why he feels down depressed. Sates that he is still dealing with the death of his brother. States he did not know it was going to hit him this bad. States that today he has been more sad, wants to isolate. Does not want to be in contact with people.  Diagnosis:   DSM5: Schizophrenia Disorders:  denies Obsessive-Compulsive Disorders:  denies Trauma-Stressor Disorders:  denies Substance/Addictive Disorders:  Alcohol Related Disorder - Severe (303.90) Depressive Disorders:  Major Depressive Disorder - Severe (296.23) Total Time spent with patient: 30 minutes  Axis I: Bereavement and Substance Induced Mood Disorder  ADL's:  Intact  Sleep: Fair  Appetite:  Fair  Suicidal Ideation:  Plan:  denies Intent:  denies Means:  denies Homicidal Ideation:  Plan:  denies Intent:  denies Means:  denies AEB (as evidenced by):  Psychiatric Specialty Exam: Physical Exam  Review of Systems  Constitutional: Positive for malaise/fatigue.  HENT: Negative.   Eyes: Negative.   Respiratory: Negative.   Cardiovascular: Negative.   Gastrointestinal: Negative.   Genitourinary: Negative.   Musculoskeletal: Positive for back pain and joint pain.  Skin: Negative.   Neurological: Negative.   Endo/Heme/Allergies: Negative.   Psychiatric/Behavioral: Positive for depression and substance abuse. The patient is nervous/anxious.     Blood pressure 93/62, pulse 98, temperature 97.9 F (36.6 C), temperature source Oral, resp. rate 16, height 5\' 8"  (1.727 m), weight 67.132 kg (148 lb), SpO2 99.00%.Body mass index is 22.51 kg/(m^2).  General Appearance: Fairly Groomed and in bed  Eye Contact::  Fair  Speech:  Clear and Coherent and Slow  Volume:  Decreased  Mood:  Depressed  Affect:  Depressed  Thought Process:  Coherent and Goal Directed  Orientation:   Full (Time, Place, and Person)  Thought Content:  symtpoms, worries, concerns, the loss of his brother  Suicidal Thoughts:  No  Homicidal Thoughts:  No  Memory:  Immediate;   Fair Recent;   Fair Remote;   Fair  Judgement:  Fair  Insight:  Present  Psychomotor Activity:  Psychomotor Retardation  Concentration:  Fair  Recall:  [employer] of Knowledge:NA  Language: Fair  Akathisia:  No  Handed:    AIMS (if indicated):     Assets:  Desire for Improvement  Sleep:  Number of Hours: 6.75   Musculoskeletal: Strength & Muscle Tone: within normal limits Gait & Station: unsteady Patient leans: N/A  Current Medications: Current Facility-Administered Medications  Medication Dose Route Frequency Provider Last Rate Last Dose  . acetaminophen (TYLENOL) tablet 650 mg  650 mg Oral Q6H PRN Encarnacion Slates, NP   650 mg at 04/27/13 0750  . alum & mag hydroxide-simeth (MAALOX/MYLANTA) 200-200-20 MG/5ML suspension 30 mL  30 mL Oral Q4H PRN Encarnacion Slates, NP      . chlordiazePOXIDE (LIBRIUM) capsule 25 mg  25 mg Oral BH-qamhs Encarnacion Slates, NP   25 mg at 04/28/13 0748   Followed by  . [START ON 04/29/2013] chlordiazePOXIDE (LIBRIUM) capsule 25 mg  25 mg Oral Daily Encarnacion Slates, NP      . feeding supplement (ENSURE COMPLETE) (ENSURE COMPLETE) liquid 237 mL  237 mL Oral TID WC Nicholaus Bloom, MD   237 mL at 04/28/13 0747  . fluticasone (FLONASE) 50 MCG/ACT nasal spray 1 spray  1 spray Each Nare Daily Herbert Pun  I Nwoko, NP   1 spray at 04/28/13 0746  . lisinopril (PRINIVIL,ZESTRIL) tablet 10 mg  10 mg Oral Daily Encarnacion Slates, NP   10 mg at 04/28/13 0748  . loratadine (CLARITIN) tablet 10 mg  10 mg Oral Daily Encarnacion Slates, NP   10 mg at 04/28/13 0748  . magnesium hydroxide (MILK OF MAGNESIA) suspension 30 mL  30 mL Oral Daily PRN Encarnacion Slates, NP      . multivitamin with minerals tablet 1 tablet  1 tablet Oral Daily Encarnacion Slates, NP   1 tablet at 04/28/13 0747  . nicotine (NICODERM CQ - dosed in mg/24 hours)  patch 21 mg  21 mg Transdermal Q0600 Encarnacion Slates, NP      . pantoprazole (PROTONIX) EC tablet 40 mg  40 mg Oral Daily Encarnacion Slates, NP   40 mg at 04/28/13 0800  . PARoxetine (PAXIL) tablet 20 mg  20 mg Oral Daily Encarnacion Slates, NP   20 mg at 04/28/13 0748  . thiamine (B-1) injection 100 mg  100 mg Intramuscular Once Encarnacion Slates, NP      . thiamine (VITAMIN B-1) tablet 100 mg  100 mg Oral Daily Encarnacion Slates, NP   100 mg at 04/28/13 0747  . traZODone (DESYREL) tablet 100 mg  100 mg Oral QHS Encarnacion Slates, NP   100 mg at 04/27/13 2045    Lab Results:  Results for orders placed during the hospital encounter of 04/25/13 (from the past 48 hour(s))  URINALYSIS, ROUTINE W REFLEX MICROSCOPIC     Status: None   Collection Time    04/27/13 11:51 AM      Result Value Ref Range   Color, Urine YELLOW  YELLOW   APPearance CLEAR  CLEAR   Specific Gravity, Urine 1.019  1.005 - 1.030   pH 5.5  5.0 - 8.0   Glucose, UA NEGATIVE  NEGATIVE mg/dL   Hgb urine dipstick NEGATIVE  NEGATIVE   Bilirubin Urine NEGATIVE  NEGATIVE   Ketones, ur NEGATIVE  NEGATIVE mg/dL   Protein, ur NEGATIVE  NEGATIVE mg/dL   Urobilinogen, UA 0.2  0.0 - 1.0 mg/dL   Nitrite NEGATIVE  NEGATIVE   Leukocytes, UA NEGATIVE  NEGATIVE   Comment: MICROSCOPIC NOT DONE ON URINES WITH NEGATIVE PROTEIN, BLOOD, LEUKOCYTES, NITRITE, OR GLUCOSE <1000 mg/dL.     Performed at [facility]    Physical Findings: AIMS: Facial and Oral Movements Muscles of Facial Expression: None, normal Lips and Perioral Area: None, normal Jaw: None, normal Tongue: None, normal,Extremity Movements Upper (arms, wrists, hands, fingers): None, normal Lower (legs, knees, ankles, toes): None, normal, Trunk Movements Neck, shoulders, hips: None, normal, Overall Severity Severity of abnormal movements (highest score from questions above): None, normal Incapacitation due to abnormal movements: None, normal Patient's awareness of abnormal  movements (rate only patient's report): No Awareness, Dental Status Current problems with teeth and/or dentures?: Yes Does patient usually wear dentures?: No  CIWA:  CIWA-Ar Total: 1 COWS:     Treatment Plan Summary: Daily contact with patient to assess and evaluate symptoms and progress in treatment Medication management  Plan: Supportive approach/coping skills/relapse prevention           Grief and loss           Consider increasing the Paxil Medical Decision Making Problem Points:  Review of psycho-social stressors (1) Data Points:  Review of medication regiment & side effects (2)  I certify that inpatient services furnished can reasonably be expected to improve the patient's condition.   Ahmani Prehn A 04/28/2013, 4:09 PM

## 2013-04-28 NOTE — Progress Notes (Signed)
D:  Pt +ve AH- shadows. Pt denies SI/HI/VH. Pt is pleasant and cooperative. Pt stated he felt better after talking to chaplin today. Pt was very talkative to Probation officer and explained he has not felt comfortable talking to anyone since his brother died. Pt stated he enjoyed talking to Probation officer, "you listened and I really needed it"    A: Pt was offered support and encouragement. Pt was given scheduled medications. Pt was encourage to attend groups. Q 15 minute checks were done for safety.   R:Pt attends groups and interacts well with peers and staff. Pt is taking medication. Pt has no complaints at this time.Pt receptive to treatment and safety maintained on unit.

## 2013-04-28 NOTE — Progress Notes (Signed)
  D) Patient pleasant and cooperative upon my assessment. Patient completed Patient Self Inventory, reports slept "only after requesting medication," and  appetite is "improving." Elijah [name] is taking Ensure supplements, "I will drink my Ensure after meals from now on, I think it is ruining my appetite." Patient rates depression as   5/10, patient rates hopeless feelings as 3 /10. Elijah [name] states "my depression is up and down right now." Patient denies SI/HI, denies A/V hallucinations.   A) Patient offered support and encouragement, patient encouraged to discuss feelings/concerns with staff. Patient verbalized understanding. Patient monitored Q15 minutes for safety. Patient met with MD  to discuss today's goals and plan of care. Educated Elijah [name] re his high fall risk status, encouraged to please call for assistance when needed and report any dizziness, patient verbalizes understanding.   R) Patient visible in milieu, attending groups in day room and meals in dining room. Elijah [name] is insightful with a plan to "completely stop drinking."  Patient appropriate with staff and peers.   Patient taking medications as ordered. Will continue to monitor.

## 2013-04-29 DIAGNOSIS — F411 Generalized anxiety disorder: Secondary | ICD-10-CM

## 2013-04-29 MED ORDER — PAROXETINE HCL 10 MG PO TABS
30.0000 mg | ORAL_TABLET | Freq: Every day | ORAL | Status: DC
  Administered 2013-04-30: 30 mg via ORAL
  Filled 2013-04-29 (×3): qty 3

## 2013-04-29 MED ORDER — HYDROXYZINE HCL 25 MG PO TABS
25.0000 mg | ORAL_TABLET | Freq: Four times a day (QID) | ORAL | Status: DC | PRN
  Administered 2013-04-29 – 2013-04-30 (×2): 25 mg via ORAL
  Filled 2013-04-29 (×2): qty 1

## 2013-04-29 NOTE — BHH Group Notes (Signed)
[facility] LCSW Aftercare Discharge Planning Group Note   04/29/2013 9:57 AM  Participation Quality:  Appropriate   Mood/Affect:  Depressed and Flat  Depression Rating:  9  Anxiety Rating:  9  Thoughts of Suicide:  No Will you contract for safety?   NA  Current AVH:  No  Plan for Discharge/Comments:  Pt reports that he had a good weekend but is feeling down today. Pt would like someone to go in his locker and see what meds he was taking prior to going to [facility]. Pt reports that he is sleeping and eating well but feels that he needs meds adjusted to help with mood.   Transportation Means: unknown   Supports: unknown   Proofreader, Research officer, trade union

## 2013-04-29 NOTE — Progress Notes (Signed)
[facility] MD Progress Note  04/29/2013 2:43 PM DAVONTAE PRUSINSKI  MRN:  [id number] Subjective:  Leonard states that he is not doing well. He has episodes of crying. Endorses persistent anxiety. He still claims to not know why is he this depressed but on the other hand recognizes that he is still grieving the death of his brother. He is also aware that some of that mood instability is also a part of coming off the acohol Diagnosis:   DSM5: Schizophrenia Disorders:  none Obsessive-Compulsive Disorders:  none Trauma-Stressor Disorders:  none Substance/Addictive Disorders:  Alcohol Related Disorder - Severe (303.90) Depressive Disorders:  Major Depressive Disorder - Moderate (296.22) Total Time spent with patient: 30 minutes  Axis I: Bereavement and Generalized Anxiety Disorder  ADL's:  Intact  Sleep: Fair  Appetite:  Fair  Suicidal Ideation:  Plan:  denies Intent:  denies Means:  denies Homicidal Ideation:  Plan:  denies Intent:  denies Means:  denies AEB (as evidenced by):  Psychiatric Specialty Exam: Physical Exam  Review of Systems  Constitutional: Positive for malaise/fatigue.  HENT: Negative.   Eyes: Negative.   Respiratory: Negative.   Cardiovascular: Negative.   Gastrointestinal: Negative.   Genitourinary: Negative.   Musculoskeletal: Positive for back pain and joint pain.  Skin: Negative.   Neurological: Negative.   Endo/Heme/Allergies: Negative.   Psychiatric/Behavioral: Positive for depression and substance abuse. The patient is nervous/anxious.     Blood pressure 138/87, pulse 87, temperature 98 F (36.7 C), temperature source Oral, resp. rate 16, height 5\' 8"  (1.727 m), weight 67.132 kg (148 lb), SpO2 99.00%.Body mass index is 22.51 kg/(m^2).  General Appearance: Fairly Groomed  Engineer, water::  Fair  Speech:  Clear and Coherent and not spontaneous  Volume:  Decreased  Mood:  Depressed  Affect:  Depressed  Thought Process:  Coherent and Goal Directed  Orientation:   Full (Time, Place, and Person)  Thought Content:  symptoms, worries, concerns, loss of his brother  Suicidal Thoughts:  No  Homicidal Thoughts:  No  Memory:  Immediate;   Fair Recent;   Fair Remote;   Fair  Judgement:  Fair  Insight:  Present  Psychomotor Activity:  Psychomotor Retardation  Concentration:  Fair  Recall:  [employer] of Knowledge:NA  Language: Fair  Akathisia:  No  Handed:    AIMS (if indicated):     Assets:  Desire for Improvement  Sleep:  Number of Hours: 6.25   Musculoskeletal: Strength & Muscle Tone: within normal limits Gait & Station: unsteady Patient leans: N/A  Current Medications: Current Facility-Administered Medications  Medication Dose Route Frequency Provider Last Rate Last Dose  . acetaminophen (TYLENOL) tablet 650 mg  650 mg Oral Q6H PRN Encarnacion Slates, NP   650 mg at 04/27/13 0750  . alum & mag hydroxide-simeth (MAALOX/MYLANTA) 200-200-20 MG/5ML suspension 30 mL  30 mL Oral Q4H PRN Encarnacion Slates, NP      . feeding supplement (ENSURE COMPLETE) (ENSURE COMPLETE) liquid 237 mL  237 mL Oral TID WC Nicholaus Bloom, MD   237 mL at 04/29/13 1302  . fluticasone (FLONASE) 50 MCG/ACT nasal spray 1 spray  1 spray Each Nare Daily Encarnacion Slates, NP   1 spray at 04/29/13 0842  . hydrOXYzine (ATARAX/VISTARIL) tablet 25 mg  25 mg Oral Q6H PRN Nicholaus Bloom, MD   25 mg at 04/29/13 1302  . lisinopril (PRINIVIL,ZESTRIL) tablet 10 mg  10 mg Oral Daily Encarnacion Slates, NP   10 mg  at 04/29/13 0839  . loratadine (CLARITIN) tablet 10 mg  10 mg Oral Daily Encarnacion Slates, NP   10 mg at 04/29/13 0839  . magnesium hydroxide (MILK OF MAGNESIA) suspension 30 mL  30 mL Oral Daily PRN Encarnacion Slates, NP      . multivitamin with minerals tablet 1 tablet  1 tablet Oral Daily Encarnacion Slates, NP   1 tablet at 04/29/13 [phone]  . nicotine (NICODERM CQ - dosed in mg/24 hours) patch 21 mg  21 mg Transdermal Q0600 Encarnacion Slates, NP   21 mg at 04/29/13 7619  . pantoprazole (PROTONIX) EC tablet 40  mg  40 mg Oral Daily Encarnacion Slates, NP   40 mg at 04/29/13 0839  . [START ON 04/30/2013] PARoxetine (PAXIL) tablet 30 mg  30 mg Oral Daily Nicholaus Bloom, MD      . thiamine (B-1) injection 100 mg  100 mg Intramuscular Once Encarnacion Slates, NP      . thiamine (VITAMIN B-1) tablet 100 mg  100 mg Oral Daily Encarnacion Slates, NP   100 mg at 04/29/13 0839  . traZODone (DESYREL) tablet 100 mg  100 mg Oral QHS Encarnacion Slates, NP   100 mg at 04/28/13 2140    Lab Results: No results found for this or any previous visit (from the past 48 hour(s)).  Physical Findings: AIMS: Facial and Oral Movements Muscles of Facial Expression: None, normal Lips and Perioral Area: None, normal Jaw: None, normal Tongue: None, normal,Extremity Movements Upper (arms, wrists, hands, fingers): None, normal Lower (legs, knees, ankles, toes): None, normal, Trunk Movements Neck, shoulders, hips: None, normal, Overall Severity Severity of abnormal movements (highest score from questions above): None, normal Incapacitation due to abnormal movements: None, normal Patient's awareness of abnormal movements (rate only patient's report): No Awareness, Dental Status Current problems with teeth and/or dentures?: Yes Does patient usually wear dentures?: No  CIWA:  CIWA-Ar Total: 1 COWS:     Treatment Plan Summary: Daily contact with patient to assess and evaluate symptoms and progress in treatment Medication management  Plan: Supportive approach/coping skills/relapse prevention           Grief-loss           Increase the Paxil to 30 mg            Use Vistaril 25 mg Q 6 H PRN anxiety  Medical Decision Making Problem Points:  Review of psycho-social stressors (1) Data Points:  Review of medication regiment & side effects (2) Review of new medications or change in dosage (2)  I certify that inpatient services furnished can reasonably be expected to improve the patient's condition.   Kieth Hartis A 04/29/2013, 2:43 PM

## 2013-04-29 NOTE — Progress Notes (Signed)
Adult Psychoeducational Group Note  Date:  04/29/2013 Time:  10:00 am  Group Topic/Focus:  Wellness Toolbox:   The focus of this group is to discuss various aspects of wellness, balancing those aspects and exploring ways to increase the ability to experience wellness.  Patients will create a wellness toolbox for use upon discharge.  Participation Level:  Active  Participation Quality:  Appropriate, Sharing and Supportive  Affect:  Appropriate  Cognitive:  Appropriate  Insight: Appropriate  Engagement in Group:  Engaged  Modes of Intervention:  Discussion, Education, Socialization and Support  Additional Comments: Pt stated that by working and practicing his religion that he can promote health and wellness in his life. Pt stated that grief and and depression are barriers to his progress.   Elijah [name] 04/29/2013, 11:02 AM

## 2013-04-29 NOTE — Progress Notes (Signed)
D: pt stated he is doing a lot better compared to this morning. Pt is calm and cooperative, appropriate interaction. denies si/hi/avh. denies pain. Fair eye contact, flat expression.  A: 1:1 time given. Support and encouragement offered. q 15 min safety checks R: pt remains safe on unit. No signs of distress or complaints at this time 

## 2013-04-29 NOTE — Progress Notes (Signed)
Pt is very pleasant and cooperative. He requested to use his cane stating that he is feeling weaker when he uses a walker. Dr. Sabra Heck was in agreement with this and pt phoned a family member to bring in his cane. He denies SI and HI and contracts for safety.

## 2013-04-29 NOTE — BHH Group Notes (Signed)
[facility] LCSW Group Therapy  04/29/2013 3:33 PM  Type of Therapy:  Group Therapy  Participation Level:  Did Not Attend-pt had phone interview with Melissa from [place] during group.    Smart, Domanick Cuccia LCSWA  04/29/2013, 3:33 PM

## 2013-04-30 DIAGNOSIS — F411 Generalized anxiety disorder: Secondary | ICD-10-CM

## 2013-04-30 MED ORDER — PAROXETINE HCL 20 MG PO TABS
20.0000 mg | ORAL_TABLET | Freq: Every day | ORAL | Status: DC
  Administered 2013-05-01: 20 mg via ORAL
  Filled 2013-04-30: qty 14
  Filled 2013-04-30 (×2): qty 1

## 2013-04-30 MED ORDER — CLONIDINE HCL 0.2 MG PO TABS
0.2000 mg | ORAL_TABLET | Freq: Once | ORAL | Status: AC
  Administered 2013-04-30: 0.2 mg via ORAL
  Filled 2013-04-30: qty 1

## 2013-04-30 MED ORDER — SUMATRIPTAN SUCCINATE 50 MG PO TABS
50.0000 mg | ORAL_TABLET | Freq: Once | ORAL | Status: AC
  Administered 2013-04-30: 50 mg via ORAL
  Filled 2013-04-30: qty 1

## 2013-04-30 MED ORDER — METHOCARBAMOL 500 MG PO TABS
500.0000 mg | ORAL_TABLET | Freq: Once | ORAL | Status: AC
  Administered 2013-04-30: 500 mg via ORAL
  Filled 2013-04-30: qty 1

## 2013-04-30 MED ORDER — GABAPENTIN 100 MG PO CAPS
100.0000 mg | ORAL_CAPSULE | Freq: Three times a day (TID) | ORAL | Status: DC
Start: 2013-04-30 — End: 2013-04-30
  Filled 2013-04-30 (×3): qty 1

## 2013-04-30 MED ORDER — SUMATRIPTAN SUCCINATE 25 MG PO TABS: 25.0000 mg | ORAL_TABLET | Freq: Once | ORAL | Status: DC

## 2013-04-30 NOTE — BHH Suicide Risk Assessment (Signed)
[place] INPATIENT:  Family/Significant Other Suicide Prevention Education  Suicide Prevention Education:  Education Completed; Albin Duckett (pt's niece) [phone] has been identified by the patient as the family member/significant other with whom the patient will be residing, and identified as the person(s) who will aid the patient in the event of a mental health crisis (suicidal ideations/suicide attempt).  With written consent from the patient, the family member/significant other has been provided the following suicide prevention education, prior to the and/or following the discharge of the patient.  The suicide prevention education provided includes the following:  Suicide risk factors  Suicide prevention and interventions  National Suicide Hotline telephone number  [facility] assessment telephone number  [facility] Emergency Assistance [place] and/or Residential Mobile Crisis Unit telephone number  Request made of family/significant other to:  Remove weapons (e.g., guns, rifles, knives), all items previously/currently identified as safety concern.    Remove drugs/medications (over-the-counter, prescriptions, illicit drugs), all items previously/currently identified as a safety concern.  The family member/significant other verbalizes understanding of the suicide prevention education information provided.  The family member/significant other agrees to remove the items of safety concern listed above.  Smart, Mattia Osterman LCSWA 04/30/2013, 8:56 AM

## 2013-04-30 NOTE — Discharge Instructions (Signed)
EKG was normal.   Recent labs reviewed.  Increase fluids.  Follow up your dr

## 2013-04-30 NOTE — ED Notes (Signed)
Per ems pt from [facility] with sitter. Reports he felt he was falling, tried to grab something but fell. denies injury or trauma. Reports headache 6/10, hx of cluster headaches. Hx of falls

## 2013-04-30 NOTE — Progress Notes (Signed)
Pt transported to [facility] by EMS, attempted to notify pt's niece Loralee Pacas of pt's fall per pt request, niece did not answer phone after 3 attempts to contact her.

## 2013-04-30 NOTE — Progress Notes (Signed)
Adult Psychoeducational Group Note  Date:  04/30/2013 Time:  3:26 AM  Group Topic/Focus:  Wrap-Up Group:   The focus of this group is to help patients review their daily goal of treatment and discuss progress on daily workbooks.  Participation Level:  Did Not Attend   Additional Comments:  Patient stated he was extremely tired.  Haylynn Pha, [place] 04/30/2013, 3:26 AM

## 2013-04-30 NOTE — Progress Notes (Signed)
Pt pulled emergency button in bathroom, RN entered room to find pt on floor after un witnessed fall, pt c/o numbness in legs and states "my legs gave out", MD and AC notifed, pt a & o x4, denies hitting his head or losing consciousness, pt is c/o mild left should pain, pt ambulated to bathroom without walker or cane, pt was seen by PT today and was provided a walker and instructed to use with any ambulation, placed yellow socks on pt and helped pt back to bed, [facility] called EMS to transport pt to [facility] for evaluation, pt asked staff to notify his niece Loralee Pacas of the incident.

## 2013-04-30 NOTE — BHH Group Notes (Signed)
[facility] LCSW Group Therapy  04/30/2013 2:56 PM  Type of Therapy:  Group Therapy  Participation Level:  Did Not Attend-pt in room resting/did not attend   Smart, Elijah [name]  04/30/2013, 2:56 PM

## 2013-04-30 NOTE — ED Notes (Signed)
Pt sts he lost his balance today and fell, sts it happens often. Denies injury, reports headache

## 2013-04-30 NOTE — Progress Notes (Signed)
[facility] MD Progress Note  04/30/2013 6:28 PM Elijah [name]  MRN:  [id number] Subjective:  Durwin was able to get his cane. Felt better using the cane. He was accepted to go to [facility] on 4/8. Later in the morning stated he was not feeling too well. Not specific complains. Still feeling depressed about death of his brother.He was also experiencing anxiety He was in bed later this afternoon and stated he was feeling better. He later went to the bathroom without his walker or cane and fell. Did not sustain trauma to head but was complaining of weakness on his legs. He stated he hit his left shoulder when he fell. Diagnosis:   DSM5: Schizophrenia Disorders:  none Obsessive-Compulsive Disorders:  none Trauma-Stressor Disorders:  none Substance/Addictive Disorders:  Alcohol Related Disorder - Severe (303.90) Depressive Disorders:  Major Depressive Disorder - Severe (296.23) Total Time spent with patient: 30 minutes  Axis I: Substance Induced Mood Disorder, Anxiety disorder  ADL's:  Intact  Sleep: Fair  Appetite:  Fair  Suicidal Ideation:  Plan:  denies Intent:  denies Means:  denies Homicidal Ideation:  Plan:  denies Intent:  denies Means:  denies AEB (as evidenced by):  Psychiatric Specialty Exam: Physical Exam  Review of Systems  Constitutional: Positive for malaise/fatigue.  HENT: Negative.   Eyes: Negative.   Respiratory: Negative.   Cardiovascular: Negative.   Gastrointestinal: Negative.   Genitourinary: Negative.   Musculoskeletal: Positive for back pain.  Skin: Negative.   Neurological: Positive for weakness.  Endo/Heme/Allergies: Negative.   Psychiatric/Behavioral: Positive for depression and substance abuse. The patient is nervous/anxious.     Blood pressure 99/55, pulse 69, temperature 98.2 F (36.8 C), temperature source Oral, resp. rate 16, height 5\' 8"  (1.727 m), weight 67.132 kg (148 lb), SpO2 97.00%.Body mass index is 22.51 kg/(m^2).  General Appearance: Disheveled   Eye Sport and exercise psychologist::  Fair  Speech:  Clear and Coherent and Slow  Volume:  Decreased  Mood:  Anxious and Depressed  Affect:  Restricted  Thought Process:  Coherent and Goal Directed  Orientation:  Full (Time, Place, and Person)  Thought Content:  symtpoms, worries, concerns  Suicidal Thoughts:  No  Homicidal Thoughts:  No  Memory:  Immediate;   Fair Recent;   Fair Remote;   Fair  Judgement:  Fair  Insight:  Shallow  Psychomotor Activity:  Restlessness  Concentration:  Fair  Recall:  [employer] of Knowledge:NA  Language: Fair  Akathisia:  No  Handed:    AIMS (if indicated):     Assets:  Desire for Improvement  Sleep:  Number of Hours: 6.25   Musculoskeletal: Strength & Muscle Tone: within normal limits Gait & Station: unsteady Patient leans: N/A  Current Medications: Current Facility-Administered Medications  Medication Dose Route Frequency Provider Last Rate Last Dose  . acetaminophen (TYLENOL) tablet 650 mg  650 mg Oral Q6H PRN Encarnacion Slates, NP   650 mg at 04/27/13 0750  . alum & mag hydroxide-simeth (MAALOX/MYLANTA) 200-200-20 MG/5ML suspension 30 mL  30 mL Oral Q4H PRN Encarnacion Slates, NP      . feeding supplement (ENSURE COMPLETE) (ENSURE COMPLETE) liquid 237 mL  237 mL Oral TID WC Nicholaus Bloom, MD   237 mL at 04/30/13 1142  . fluticasone (FLONASE) 50 MCG/ACT nasal spray 1 spray  1 spray Each Nare Daily Encarnacion Slates, NP   1 spray at 04/30/13 [phone]  . gabapentin (NEURONTIN) capsule 100 mg  100 mg Oral TID Encarnacion Slates,  NP      . hydrOXYzine (ATARAX/VISTARIL) tablet 25 mg  25 mg Oral Q6H PRN Nicholaus Bloom, MD   25 mg at 04/30/13 1045  . lisinopril (PRINIVIL,ZESTRIL) tablet 10 mg  10 mg Oral Daily Encarnacion Slates, NP   10 mg at 04/30/13 0815  . loratadine (CLARITIN) tablet 10 mg  10 mg Oral Daily Encarnacion Slates, NP   10 mg at 04/30/13 0816  . magnesium hydroxide (MILK OF MAGNESIA) suspension 30 mL  30 mL Oral Daily PRN Encarnacion Slates, NP      . multivitamin with minerals tablet 1  tablet  1 tablet Oral Daily Encarnacion Slates, NP   1 tablet at 04/30/13 0816  . nicotine (NICODERM CQ - dosed in mg/24 hours) patch 21 mg  21 mg Transdermal Q0600 Encarnacion Slates, NP   21 mg at 04/30/13 0816  . pantoprazole (PROTONIX) EC tablet 40 mg  40 mg Oral Daily Encarnacion Slates, NP   40 mg at 04/30/13 0816  . PARoxetine (PAXIL) tablet 30 mg  30 mg Oral Daily Nicholaus Bloom, MD   30 mg at 04/30/13 0815  . thiamine (B-1) injection 100 mg  100 mg Intramuscular Once Encarnacion Slates, NP      . thiamine (VITAMIN B-1) tablet 100 mg  100 mg Oral Daily Encarnacion Slates, NP   100 mg at 04/30/13 0815  . traZODone (DESYREL) tablet 100 mg  100 mg Oral QHS Encarnacion Slates, NP   100 mg at 04/29/13 2142   Current Outpatient Prescriptions  Medication Sig Dispense Refill  . pantoprazole (PROTONIX) 40 MG tablet Take 40 mg by mouth daily.      Marland Kitchen PARoxetine (PAXIL) 20 MG tablet Take 20 mg by mouth daily.      Marland Kitchen thiamine 100 MG tablet Take 100 mg by mouth daily.      . traMADol (ULTRAM) 50 MG tablet Take 50 mg by mouth every 6 (six) hours as needed for moderate pain.      . traZODone (DESYREL) 100 MG tablet Take 100 mg by mouth at bedtime.        Lab Results: No results found for this or any previous visit (from the past 48 hour(s)).  Physical Findings: AIMS: Facial and Oral Movements Muscles of Facial Expression: None, normal Lips and Perioral Area: None, normal Jaw: None, normal Tongue: None, normal,Extremity Movements Upper (arms, wrists, hands, fingers): None, normal Lower (legs, knees, ankles, toes): None, normal, Trunk Movements Neck, shoulders, hips: None, normal, Overall Severity Severity of abnormal movements (highest score from questions above): None, normal Incapacitation due to abnormal movements: None, normal Patient's awareness of abnormal movements (rate only patient's report): No Awareness, Dental Status Current problems with teeth and/or dentures?: Yes Does patient usually wear dentures?: No   CIWA:  CIWA-Ar Total: 0 COWS:     Treatment Plan Summary: Daily contact with patient to assess and evaluate symptoms and progress in treatment Medication management  Plan: Supportive approach/copign skills/relapse prevention           WIll send to the ED for evaluation           Will D/C the Neurontin and decrese the Paxil back to 20  Medical Decision Making Problem Points:  New problem, with no additional work-up planned (3) and Review of psycho-social stressors (1) Data Points:  Review of medication regiment & side effects (2) Review of new medications or change in dosage (2)  I  certify that inpatient services furnished can reasonably be expected to improve the patient's condition.   Asjah Rauda A 04/30/2013, 6:28 PM

## 2013-04-30 NOTE — BHH Group Notes (Signed)
Patient attended group, affect was tearful at times. Patient stated that he wants to get better and has a great support system that includes his brothers and niece. Patient was very willing to participate and share in the group. Pt Shared that his stressor was a death of a brother and his short term goal was to attend all group meetings today and to attend meetings upon discharge from [facility].

## 2013-04-30 NOTE — ED Notes (Signed)
Report given to Nickerson called r/t need for transport

## 2013-04-30 NOTE — Progress Notes (Signed)
Patient ID: Elijah [name], male   DOB: September 05, 1950, 63 y.o.   MRN: [id number] D- Patient reports he slept fair and his appetite is improving.  His ability to pay attention is improving,  He rates his depression at 5/10 and hopelessness at 3/10.  He denies thoughts of self harm.  His only detox complaint is some chilling.  A- Supported patient.  r- paatient became anxious and asked for anxiety medication before lunch.  "I'm trying to head it off" Began to feel worse after medication.  Patient got a very bad headache, was closing his eyes and holding his head. His BP was elevated /  MD notified and patient seen by MD and NP.  Patient given imitrex, clonidine and robaxin.  R- patient resting after taking meds.  Appears to be asleep.  Respirations regular.

## 2013-04-30 NOTE — Progress Notes (Signed)
Recreation Therapy Notes  Animal-Assisted Activity/Therapy (AAA/T) Program Checklist/Progress Notes Patient Eligibility Criteria Checklist & Daily Group note for Rec Tx Intervention  Date: 04.07.2015 Time: 2:45pm Location: [address]   AAA/T Program Assumption of Risk Form signed by Patient/ or Parent Legal Guardian yes  Patient is free of allergies or sever asthma yes  Patient reports no fear of animals yes  Patient reports no history of cruelty to animals yes   Patient understands his/her participation is voluntary yes  Behavioral Response: Did not attend.   Laureen Ochs Vee Bahe, LRT/CTRS  Lane Hacker 04/30/2013 4:44 PM

## 2013-04-30 NOTE — BHH Group Notes (Signed)
Adult Psychoeducational Group Note  Date:  04/30/2013 Time:  10:13 PM  Group Topic/Focus:  Wrap-Up Group:   The focus of this group is to help patients review their daily goal of treatment and discuss progress on daily workbooks.  Participation Level:  Did Not Attend  Participation Quality:  None  Affect:  None  Cognitive:  None  Insight: None  Engagement in Group:  None  Modes of Intervention:  Discussion  Additional Comments:  Elijah [name] did not attend group.  Victorino Sparrow A 04/30/2013, 10:13 PM

## 2013-04-30 NOTE — Evaluation (Signed)
Physical Therapy Evaluation Patient Details Name: Elijah [name] MRN: [id number] DOB: 05-09-1950 Today's Date: 04/30/2013   History of Present Illness  Pt is a 63 year old male admitted to [facility] for alcohol Dependence and MDD.  Pt reported that his biggest problem is his grief, states he is still grieving the loss of his brother.  Clinical Impression  Pt currently with functional limitations due to the deficits listed below (see PT Problem List).  Pt will benefit from skilled PT to increase their independence and safety with mobility to allow discharge to the venue listed below.  RN reports steady gait this morning however upon working with pt with SPC, pt very unsteady and had LOB so recommended continuing to use RW for safety.  Pt presents with weakness on R LE, hx of hip sx many years ago per pt, and hx of falls.  Pt with headache which was better after medication per pt however increased some with ambulation.  If pt remains at [facility], will continue to see for gait training, balance activities, and strengthening.  Pt reports d/c plans to another facility however may benefit from [facility] upon return home.     Follow Up Recommendations Home health PT    Equipment Recommendations  None recommended by PT    Recommendations for Other Services       Precautions / Restrictions Precautions Precautions: Fall      Mobility  Bed Mobility Overal bed mobility: Modified Independent                Transfers Overall transfer level: Needs assistance   Transfers: Sit to/from Stand Sit to Stand: Supervision         General transfer comment: only supervision due to reported unsteadiness to bathroom prior to arrival and reports of headache earlier for which he was recently medicated  Ambulation/Gait Ambulation/Gait assistance: Min assist;Min guard Ambulation Distance (Feet): 100 Feet Assistive device: Rolling walker (2 wheeled);Straight cane Gait Pattern/deviations: Step-to  pattern;Step-through pattern;Antalgic Gait velocity: decr   General Gait Details: slow gait speed observed, pt with LOB with SPC requiring assist to correct while in room so encouraged continuing using RW for now for safety, min/guard with RW, increased toe out/ER on R during gait with decreased R dorsiflexion, pt with dirt and scuff marks on toe area of R sneakers and reported yes to occasionally dragging R foot or catching foot on the ground  Stairs            Wheelchair Mobility    Modified Rankin (Stroke Patients Only)       Balance Overall balance assessment: History of Falls                                           Pertinent Vitals/Pain Pt c/o headache, premedicated, returned to room with dim lights after ambulation    Home Living Family/patient expects to be discharged to:: Private residence Living Arrangements: Alone   Type of Home: House Home Access: Stairs to enter Entrance Stairs-Rails: None Entrance Stairs-Number of Steps: 4-5 Home Layout: One level Home Equipment: Environmental consultant - 2 wheels;Cane - single point      Prior Function Level of Independence: Independent with assistive device(s)               Hand Dominance        Extremity/Trunk Assessment  Lower Extremity Assessment: RLE deficits/detail;LLE deficits/detail RLE Deficits / Details: overall weaker R LE vs L LE, hip flexion and ankle DF 3+/5 LLE Deficits / Details: at least 4/5 throughout     Communication   Communication: No difficulties (difficult to understand at times)  Cognition   Behavior During Therapy: [facility] for tasks assessed/performed                        General Comments      Exercises        Assessment/Plan    PT Assessment Patient needs continued PT services  PT Diagnosis Difficulty walking   PT Problem List Decreased strength;Decreased mobility;Decreased balance;Decreased knowledge of use of DME  PT Treatment  Interventions DME instruction;Gait training;Functional mobility training;Therapeutic activities;Therapeutic exercise;Patient/family education;Neuromuscular re-education;Balance training   PT Goals (Current goals can be found in the Care Plan section) Acute Rehab PT Goals PT Goal Formulation: With patient Time For Goal Achievement: 05/14/13 Potential to Achieve Goals: Good    Frequency Min 2X/week   Barriers to discharge        Co-evaluation               End of Session Equipment Utilized During Treatment: Gait belt Activity Tolerance: Patient tolerated treatment well Patient left: in bed      Functional Assessment Tool Used: clinical judgement Functional Limitation: Mobility: Walking and moving around Mobility: Walking and Moving Around Current Status (O9629): At least 20 percent but less than 40 percent impaired, limited or restricted Mobility: Walking and Moving Around Goal Status [phone]): At least 1 percent but less than 20 percent impaired, limited or restricted    Time: 1415-1438 PT Time Calculation (min): 23 min   Charges:   PT Evaluation $Initial PT Evaluation Tier I: 1 Procedure PT Treatments $Gait Training: 8-22 mins   PT G Codes:   Functional Assessment Tool Used: clinical judgement Functional Limitation: Mobility: Walking and moving around    [place] E 04/30/2013, 4:09 PM Carmelia Bake, PT, DPT 04/30/2013 Pager: [phone]

## 2013-04-30 NOTE — Progress Notes (Signed)
Adult Psychoeducational Group Note  Date:  04/30/2013 Time:  10:00 am  Group Topic/Focus:  Recovery Goals:   The focus of this group is to identify appropriate goals for recovery and establish a plan to achieve them.  Participation Level:  Active  Participation Quality:  Appropriate, Sharing and Supportive  Affect:  Appropriate  Cognitive:  Appropriate  Insight: Appropriate  Engagement in Group:  Engaged  Modes of Intervention:  Discussion, Education, Socialization and Support  Additional Comments:  Pt stated that his short term goals are to  Stop drinking alcohol and start back working on projects around the house. Pt stated that his long term goals are to spend more time with his grandchildren and get his drivers license back.   Jaysa Kise 04/30/2013, 2:09 PM

## 2013-04-30 NOTE — ED Provider Notes (Signed)
CSN: [id number]     Arrival date & time 04/30/13  1726 History   First MD Initiated Contact with Patient 04/30/13 [address]     Chief Complaint  Patient presents with  . fall, sent from [facility], denies injury      (Consider location/radiation/quality/duration/timing/severity/associated sxs/prior Treatment) HPI.....Marland Kitchen status post questionable syncopal spell at behavioral health. History of falls. Patient was apparently standing at the sink when he turned around and felt like he was going to fall. This is happened before. No chest pain, neurological deficits, fever, chills, dysuria, cough. He is back to baseline. Severity is mild.  Past Medical History  Diagnosis Date  . Pancreatitis   . Hypertension   . Arthritis   . Coronary artery disease   . Hepatitis C   . Shortness of breath   . Depression   . Cancer     stomach   Past Surgical History  Procedure Laterality Date  . Abdominal surgery      stomach cancer  . Cholecystectomy    . Hernia repair    . Fracture surgery      rt hip fracture   History reviewed. No pertinent family history. History  Substance Use Topics  . Smoking status: Former Smoker    Types: Cigarettes    Quit date: 04/22/2013  . Smokeless tobacco: Not on file  . Alcohol Use: Yes     Comment: last drink yesterday (couple of beers)    Review of Systems  All other systems reviewed and are negative.      Allergies  Aspirin; Contrast media; and Ibuprofen  Home Medications   Current Outpatient Rx  Name  Route  Sig  Dispense  Refill  . pantoprazole (PROTONIX) 40 MG tablet   Oral   Take 40 mg by mouth daily.         Marland Kitchen PARoxetine (PAXIL) 20 MG tablet   Oral   Take 20 mg by mouth daily.         Marland Kitchen thiamine 100 MG tablet   Oral   Take 100 mg by mouth daily.         . traMADol (ULTRAM) 50 MG tablet   Oral   Take 50 mg by mouth every 6 (six) hours as needed for moderate pain.         . traZODone (DESYREL) 100 MG tablet   Oral   Take 100 mg by  mouth at bedtime.          BP 99/55  Pulse 69  Temp(Src) 98.2 F (36.8 C) (Oral)  Resp 16  Ht 5\' 8"  (1.727 m)  Wt 148 lb (67.132 kg)  BMI 22.51 kg/m2  SpO2 97% Physical Exam  Nursing note and vitals reviewed. Constitutional: He is oriented to person, place, and time. He appears well-developed and well-nourished.  HENT:  Head: Normocephalic and atraumatic.  Eyes: Conjunctivae and EOM are normal. Pupils are equal, round, and reactive to light.  Neck: Normal range of motion. Neck supple.  Cardiovascular: Normal rate, regular rhythm and normal heart sounds.   Pulmonary/Chest: Effort normal and breath sounds normal.  Abdominal: Soft. Bowel sounds are normal.  Musculoskeletal: Normal range of motion.  Neurological: He is alert and oriented to person, place, and time.  Skin: Skin is warm and dry.  Psychiatric: He has a normal mood and affect. His behavior is normal.    ED Course  Procedures (including critical care time) Labs Review Labs Reviewed  URINALYSIS, ROUTINE W REFLEX MICROSCOPIC  Imaging Review No results found.   EKG Interpretation   Date/Time:  Tuesday April 30 2013 19:21:16 EDT Ventricular Rate:  64 PR Interval:  169 QRS Duration: 100 QT Interval:  438 QTC Calculation: 452 R Axis:   53 Text Interpretation:  Sinus rhythm Probable left atrial enlargement  Anteroseptal infarct, old Confirmed by Rosabella Edgin  MD, Tammi Boulier ([id number]) on 04/30/2013  7:50:40 PM      MDM   Final diagnoses:  Fall    Patient appears back to baseline. No neurological deficits. EKG normal. Recent labs reviewed.  He is not anemic.  Return to behavioral health.    Nat Christen, MD 04/30/13 2053

## 2013-04-30 NOTE — Progress Notes (Signed)
D: Patient resting in bed with eyes closed.  Respirations even and unlabored.  Patient appears to be in no apparent distress. A: Staff to monitor Q 15 mins for safety.   R:Patient remains safe on the unit.  

## 2013-05-01 DIAGNOSIS — F332 Major depressive disorder, recurrent severe without psychotic features: Secondary | ICD-10-CM

## 2013-05-01 DIAGNOSIS — F1020 Alcohol dependence, uncomplicated: Principal | ICD-10-CM

## 2013-05-01 MED ORDER — HYDROXYZINE HCL 25 MG PO TABS
25.0000 mg | ORAL_TABLET | Freq: Three times a day (TID) | ORAL | Status: DC | PRN
  Filled 2013-05-01: qty 42

## 2013-05-01 MED ORDER — PAROXETINE HCL 20 MG PO TABS: 20.0000 mg | ORAL_TABLET | Freq: Every day | ORAL | Status: AC

## 2013-05-01 MED ORDER — LISINOPRIL 10 MG PO TABS: 10.0000 mg | ORAL_TABLET | Freq: Every day | ORAL | Status: AC

## 2013-05-01 MED ORDER — TRAZODONE HCL 100 MG PO TABS: 100.0000 mg | ORAL_TABLET | Freq: Every day | ORAL | Status: AC

## 2013-05-01 MED ORDER — LORATADINE 10 MG PO TABS: 10.0000 mg | ORAL_TABLET | Freq: Every day | ORAL | Status: AC

## 2013-05-01 MED ORDER — PANTOPRAZOLE SODIUM 40 MG PO TBEC: 40.0000 mg | DELAYED_RELEASE_TABLET | Freq: Every day | ORAL | Status: AC

## 2013-05-01 MED ORDER — HYDROXYZINE HCL 25 MG PO TABS
ORAL_TABLET | ORAL | Status: AC
Start: 2013-05-01 — End: ?

## 2013-05-01 MED ORDER — THIAMINE HCL 100 MG PO TABS: 100.0000 mg | ORAL_TABLET | Freq: Every day | ORAL | Status: AC

## 2013-05-01 MED ORDER — FLUTICASONE PROPIONATE 50 MCG/ACT NA SUSP: 1.0000 | Freq: Every day | NASAL | Status: AC

## 2013-05-01 NOTE — Progress Notes (Signed)
Pt was discharged to [facility] today.  He denied any S/I H/I or A/V hallucinations.    He was given f/u appointment, rx, sample medications, hotline info booklet. He voiced understanding to all instructions provided.  He requested nicotine patches to continue with his smoking cessation.

## 2013-05-01 NOTE — Progress Notes (Signed)
[facility] Adult Case Management Discharge Plan :  Will you be returning to the same living situation after discharge: No. [facility] admission today at 2:45PM At discharge, do you have transportation home?:Yes,  [facility] transporting pt to facility at 2:45PM Do you have the ability to pay for your medications:Yes,  mental health  Release of information consent forms completed and submitted to Medical Records by CSW.  Patient to Follow up at: Follow-up Information   Follow up with [facility] On 05/01/2013. ([facility] will pick you up at 2:45PM and transport you to facility. )    Contact information:   [address] [place], [place] [address] Phone: [phone] Fax: [phone]      Patient denies SI/HI:   Yes,  during group/self report.     Safety Planning and Suicide Prevention discussed:  Yes,  SPE completed with pt's neice. SPI pamphlet provided to pt and he was encouraged to share information with support network, ask questions, and talk about any concerns relating to SPE.  Alleigh Mollica Smart LCSWA  05/01/2013, 11:35 AM

## 2013-05-01 NOTE — Discharge Summary (Signed)
Physician Discharge Summary Note  Patient:  Elijah [name] is an 63 y.o., male MRN:  [id number] DOB:  1951/01/03 Patient phone:  [phone] (home)  Patient address:   [place] [address],  Total Time spent with patient: Greater than 30 minutes  Date of Admission:  04/25/2013 Date of Discharge: 05-01-13  Reason for Admission:  Alcohol detox  Discharge Diagnoses: Active Problems:   Alcohol dependence   Psychiatric Specialty Exam: Physical Exam  Psychiatric: His speech is normal and behavior is normal. Judgment and thought content normal. His mood appears not anxious. His affect is not angry, not blunt, not labile and not inappropriate. Cognition and memory are normal. He does not exhibit a depressed mood.    Review of Systems  Constitutional: Negative.   HENT: Negative.   Eyes: Negative.   Respiratory: Negative.   Cardiovascular: Negative.   Gastrointestinal: Negative.   Genitourinary: Negative.   Musculoskeletal: Positive for joint pain.       Ambulates with an assistive device  Skin: Negative.   Neurological: Negative.   Endo/Heme/Allergies: Negative.   Psychiatric/Behavioral: Positive for depression (Stable) and substance abuse (Alcoholism). Negative for suicidal ideas, hallucinations and memory loss. The patient has insomnia (Stable). The patient is not nervous/anxious.     Blood pressure 106/73, pulse 86, temperature 97.8 F (36.6 C), temperature source Oral, resp. rate 18, height 5\' 8"  (1.727 m), weight 67.132 kg (148 lb), SpO2 97.00%.Body mass index is 22.51 kg/(m^2).   General Appearance: Casual   Eye Contact:: Good   Speech: Clear and Coherent   Volume: Normal   Mood: Anxious   Affect: Appropriate and Congruent   Thought Process: Coherent and Goal Directed   Orientation: Full (Time, Place, and Person)   Thought Content: WDL   Suicidal Thoughts: No   Homicidal Thoughts: No   Memory: Immediate; Good   Judgement: Good   Insight: Fair    Psychomotor Activity: Decreased   Concentration: Fair   Recall: Good   Fund of Knowledge:Fair   Language: Good   Akathisia: NA   Handed: Right   AIMS (if indicated):   Assets: Communication Skills  Desire for Improvement  Housing  Intimacy  Physical Health  Resilience  Social Support  Transportation   Sleep: Number of Hours: 6.75    Past Psychiatric History: Diagnosis: Alcohol dependence, Severe recurrent major depression without psychotic features  Hospitalizations: [facility] adult unit  Outpatient Care:   Substance Abuse Care: [facility]  Self-Mutilation: NA  Suicidal Attempts: Yes, Hx of  Violent Behaviors: NA   Musculoskeletal: Strength & Muscle Tone: within normal limits Gait & Station: unsteady Patient leans: Ambulates with an assitive device  DSM5: Schizophrenia Disorders:  NA Obsessive-Compulsive Disorders:  NA Trauma-Stressor Disorders:  NA Substance/Addictive Disorders:  Alcohol Related Disorder - Severe (303.90) Depressive Disorders:  Severe recurrent major depression without psychotic features   Axis Diagnosis:  AXIS I:  Alcohol dependence, Severe recurrent major depression without psychotic features AXIS II:  Deferred AXIS III:   Past Medical History  Diagnosis Date  . Pancreatitis   . Hypertension   . Arthritis   . Coronary artery disease   . Hepatitis C   . Shortness of breath   . Depression   . Cancer     stomach   AXIS IV:  other psychosocial or environmental problems and Alcoholism AXIS V:  62  Level of Care:  [facility]  Hospital Course: "My brother took me to the hospital. I have nerve problems. I was  just feeling up to no good. I was thinking about hurting myself. It's been going off & on x 3 weeks, right after my brother's death. He just got ill, then passed. I was depressed prior to his death, but his passing worsened my depression. I did not attempt suicide this time, I had in the past, about 5 years ago. I took an overdose of pills and  drank a bunch of liquor. I have been drinking quite a bit, 6 cans of beer and a bottle of wine daily x 3 weeks. I'm an alcoholic. Longest sobriety 18 months.   Elijah [name] was admitted to the hospital with a blood alcohol level of 100 per toxicology results. He was intoxicated as well as suicidal associated with worsening depression since the recent death of his brother. Elijah [name] received Librium detox protocols for alcohol detox. He was also medicated with Paroxetine 20 mg daily for depression, Hydroxyzine 25 mg three times daily as needed for anxiety and Trazodone 100 mg Q bedtime for sleep. Elijah [name] also received medication management and monitoring for his other medical issues, including physical therapy evaluation for weak gait and poor balance. He was provided with an assistive device for ambulation to prevent fall/related injuries. However, Elijah [name] did sustain one fall incident 24 hours prior to his discharge. He was transferred to the ED for evaluation. ED evaluation reports indicated no obvious injuries. And further [facility] assessments indicated no injuries as well.  Elijah [name] has completed detox treatment and his mood stabilized. He is currently being discharged to continue further substance abuse treatment at the [facility] traetment center. Upon discharge, he adamantly denies any SIHI, AVH, delusional thoughts, paranoia and or withdrawal symptoms. He was provided with 14 days worth, supply samples of his [facility] discharge medications. He left [facility] with all personal belongings in no distress. Transportation per Elijah [name].   Consults:  psychiatry  Significant Diagnostic Studies:  labs: CBC with diff, CMP, UDS, toxicology tests, U/A  Discharge Vitals:   Blood pressure 106/73, pulse 86, temperature 97.8 F (36.6 C), temperature source Oral, resp. rate 18, height 5\' 8"  (1.727 m), weight 67.132 kg (148 lb), SpO2 97.00%. Body mass index is 22.51 kg/(m^2). Lab Results:   No results found for this or any previous visit  (from the past 72 hour(s)).  Physical Findings: AIMS: Facial and Oral Movements Muscles of Facial Expression: None, normal Lips and Perioral Area: None, normal Jaw: None, normal Tongue: None, normal,Extremity Movements Upper (arms, wrists, hands, fingers): None, normal Lower (legs, knees, ankles, toes): None, normal, Trunk Movements Neck, shoulders, hips: None, normal, Overall Severity Severity of abnormal movements (highest score from questions above): None, normal Incapacitation due to abnormal movements: None, normal Patient's awareness of abnormal movements (rate only patient's report): No Awareness, Dental Status Current problems with teeth and/or dentures?: Yes Does patient usually wear dentures?: No  CIWA:  CIWA-Ar Total: 0 COWS:     Psychiatric Specialty Exam: See Psychiatric Specialty Exam and Suicide Risk Assessment completed by Attending Physician prior to discharge.  Discharge destination:  [facility]  Is patient on multiple antipsychotic therapies at discharge:  No   Has Patient had three or more failed trials of antipsychotic monotherapy by history:  No  Recommended Plan for Multiple Antipsychotic Therapies: NA     Medication List    STOP taking these medications       traMADol 50 MG tablet  Commonly known as:  ULTRAM      TAKE these medications     Indication  fluticasone 50 MCG/ACT nasal spray  Commonly known as:  FLONASE  Place 1 spray into both nostrils daily. For allergies   Indication:  Perennial Rhinitis, Hayfever     hydrOXYzine 25 MG tablet  Commonly known as:  ATARAX/VISTARIL  Take 1 tablet (25 mg) three times daily as needed for anxiety   Indication:  Tension, Anxiety     lisinopril 10 MG tablet  Commonly known as:  PRINIVIL,ZESTRIL  Take 1 tablet (10 mg total) by mouth daily. For high blood pressure   Indication:  High Blood Pressure     loratadine 10 MG tablet  Commonly known as:  CLARITIN  Take 1 tablet (10 mg total) by mouth daily.  (This medicine may be purchased from over the counter at yr. Local pharmacy): For allergies   Indication:  Perennial Rhinitis, Hayfever     pantoprazole 40 MG tablet  Commonly known as:  PROTONIX  Take 1 tablet (40 mg total) by mouth daily. For acid reflux   Indication:  Gastroesophageal Reflux Disease     PARoxetine 20 MG tablet  Commonly known as:  PAXIL  Take 1 tablet (20 mg total) by mouth daily. For depression   Indication:  Major Depressive Disorder     thiamine 100 MG tablet  Take 1 tablet (100 mg total) by mouth daily. For low thiamine   Indication:  Deficiency in Thiamine or Vitamin B1     traZODone 100 MG tablet  Commonly known as:  DESYREL  Take 1 tablet (100 mg total) by mouth at bedtime. For sleep   Indication:  Trouble Sleeping       Follow-up Information   Follow up with [facility] On 05/01/2013. ([facility] will pick you up at 2:45PM and transport you to facility. )    Contact information:   [address] [place], [place] [address] Phone: [phone] Fax: [phone]     Follow-up recommendations:  Activity:  As tolerated Diet: As recommended by your primary care doctor. Keep all scheduled follow-up appointments as recommended.  Comments:  Take all your medications as prescribed by your mental healthcare provider. Report any adverse effects and or reactions from your medicines to your outpatient provider promptly. Patient is instructed and cautioned to not engage in alcohol and or illegal drug use while on prescription medicines. In the event of worsening symptoms, patient is instructed to call the crisis hotline, 911 and or go to the nearest ED for appropriate evaluation and treatment of symptoms. Follow-up with your primary care provider for your other medical issues, concerns and or health care needs.   Total Discharge Time:  Greater than 30 minutes.  Signed: Encarnacion Slates, PMHNP-BC 05/01/2013, 10:33 AM Personally evaluated the patient and agree with  assessment and plan Nicholaus Bloom. M.D.

## 2013-05-01 NOTE — Progress Notes (Signed)
   Pt laying in bed resting with eyes closed. Respirations even and unlabored. No distress noted. 1:1 continued for safety

## 2013-05-01 NOTE — Tx Team (Signed)
Interdisciplinary Treatment Plan Update (Adult)  Date: 05/01/2013   Time Reviewed: 11:28 AM  Progress in Treatment:  Attending groups: Yes  Participating in groups:  Yes  Taking medication as prescribed: Yes  Tolerating medication: Yes  Family/Significant othe contact made: SPE completed with pt's niece.  Patient understands diagnosis: Yes, AEB seeking treatment for SI, VH, mood stabilization, and ETOH detox.  Discussing patient identified problems/goals with staff: Yes  Medical problems stabilized or resolved: Yes  Denies suicidal/homicidal ideation: Yes during group/self report.  Patient has not harmed self or Others: Yes  New problem(s) identified:  Discharge Plan or Barriers: Pt accepted into [facility] today at 2:45PM  Additional comments:  Reason for Continuation of Hospitalization: d/c today  Estimated length of stay: d/c today  For review of initial/current patient goals, please see plan of care.  Attendees:  Patient:    Family:    Physician:  05/01/2013 11:28 AM   Nursing: Butch Penny RN 05/01/2013 11:29 AM   Clinical Social Worker [place], [place]  05/01/2013 11:29 AM   Other: Adonis Huguenin RN  05/01/2013 11:29 AM   Other: Gerline Legacy Nurse CM 05/01/2013 11:29 AM   Other: Hardie Pulley. PA  05/01/2013 11:29 AM   Other:   Scribe for Treatment Team:  [place] LCSWA  05/01/2013 11:28 AM

## 2013-05-01 NOTE — [facility] Group Notes (Signed)
[facility] LCSW Aftercare Discharge Planning Group Note   05/01/2013 10:47 AM  Participation Quality:  DID NOT ATTEND-pt was sleeping during group/CSW met with pt after group concluded. Pt d/cing today at 2:45PM to [facility]. Pt aware of d/c and stated that he feels stronger today and is "excited" about going into Treatment. CSW encouraged pt to call his niece to relay d/c time in order for her to bring him toiletries/clothing.   [place]

## 2013-05-01 NOTE — [facility] Counselor (Addendum)
Adult Comprehensive Assessment  Patient ID: Elijah [name], male   DOB: 03/15/1950, 63 y.o.   MRN: [id number]  Information Source: Information source: Patient  Current Stressors:  Substance abuse: increased substance abuse after death of his brother Bereavement / Loss: patient's oldest brother passed away about 5 months ago. patient reports he is still greiving  Living/Environment/Situation:  Living Arrangements: Alone Living conditions (as described by patient or guardian): patient lives in his own home with a roommate/friend whom he allows to stay there. He also has his brother's dog whom he cares for.  Patient reports he can return home with no barriers.  All basic needs met. How long has patient lived in current situation?: past 10 years What is atmosphere in current home: Supportive;Loving  Family History:  Marital status: Divorced Divorced, when?: 15 years What types of issues is patient dealing with in the relationship?: reports they are better of close friends than as a married couple Additional relationship information: patient was also in a long term common law marriage. reports they reattempted to get married, however stayed friends (currently patient is not in a relationship) Does patient have children?: Yes How many children?: 1 How is patient's relationship with their children?: Very close and also close with his grandchildren whom he reports he wants to be around to mentor and enjoy their lives  Childhood History:  By whom was/is the patient raised?: Both parents Description of patient's relationship with caregiver when they were a child: patient was very close with mother after his father passed away when he was 84 years old Patient's description of current relationship with people who raised him/her: both parents are deceased. patient still misses his mother reporting it tore him apart when she passed away Does patient have siblings?: Yes Number of Siblings:  9 Description of patient's current relationship with siblings: reports only 4 siblings left .out of 9 (7 boys, 2 girls).  reports he remains close with all brothers. Did patient suffer any verbal/emotional/physical/sexual abuse as a child?: No Did patient suffer from severe childhood neglect?: No Has patient ever been sexually abused/assaulted/raped as an adolescent or adult?: No Was the patient ever a victim of a crime or a disaster?: No Witnessed domestic violence?: No Has patient been effected by domestic violence as an adult?: No  Education:  Highest grade of school patient has completed: 1 year of college (radiology) but did not complete Currently a student?: No Name of school: NA Learning disability?: No  Employment/Work Situation:   Employment situation:  (retired) Patient's job has been impacted by current illness: No What is the longest time patient has a held a job?: patient was working with heavy Librarian, 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 reports as he has aged he is not using as heavy of drugs but currently drinking most weeks (will stop when he feels sick) beer and wine.  Patient will smoke cocaine 2 times a month If attempted suicide, did drugs/alcohol play a role in this?: No Alcohol/Substance Abuse Treatment Hx: Past Tx, Inpatient;Past Tx, Outpatient If yes, describe treatment: patient has been to [facility] in the  past and [facility] outpatient Has alcohol/substance abuse ever caused legal problems?: No  Social Support System:   Patient's Community Support  System: Good Describe Community Support System: many famly supports Type of faith/religion: Darrick Meigs How does patient's faith help to cope with current illness?: pray, faith, and reading bible.    Leisure/Recreation:   Leisure and Hobbies: patient reports he likes to tell his story and mentor  Strengths/Needs:   What things does the patient do well?: reports he enjoys helping others In what areas does patient struggle / problems for patient: makes too many decisions based off of emotion rather than logic or with his head  Discharge Plan:   Does patient have access to transportation?: Yes Will patient be returning to same living situation after discharge?: Yes Currently receiving community mental health services: Yes (From Whom) (in the past has been to [facility]) If no, would patient like referral for services when discharged?: Yes (What county?) (would like [facility] long term [facility]) Does patient have financial barriers related to discharge medications?: No  Summary/Recommendations:    Pt is 63 year old male living in [place]. Pt presents to [facility] for ETOH detox, mood stabilization, and med management. Recommendations for pt include: crisis stabilization, med management, therapeutic milieu, encourage group attendance and participation, librium taper for withdrawals, and development of comprehensive mental wellness/sobrietiy plan.   [employer] Smart 04/27/14

## 2013-05-01 NOTE — Progress Notes (Signed)
1:1 note for 0930 Pt was asleep in bed upon first assessment.  He was able to walk to medication window with MHT gait still unsteady.  He plans to be discharged today to [facility] at 1445.  1:1 continues for safety.

## 2013-05-01 NOTE — Progress Notes (Signed)
  D: Pt returned to the unit and requested strawberry ensure. Informed the writer that he feels better than he did prior to leaving but was still somewhat lightheaded. Writer encouraged pt to go to his room. Soon after pt fell asleep and wasn't given scheduled sleep med.   A:  Support and encouragement was offered. 1:1 continued for safety.  R: Pt remains safe.

## 2013-05-01 NOTE — [facility] Group Notes (Signed)
[place] LCSW Group Therapy  05/01/2013 2:39 PM  Type of Therapy:  Group Therapy  Participation Level:  Did Not Attend-pt in room preparing for d/c at 2:45PM to [place].   Grier Vu Smart LCSWA  05/01/2013, 2:39 PM

## 2013-05-01 NOTE — [facility] Suicide Risk Assessment (Signed)
   Demographic Factors:  Male, Adolescent or young adult, Low socioeconomic status and Unemployed  Total Time spent with patient: 30 minutes  Psychiatric Specialty Exam: Physical Exam  ROS  Blood pressure 106/73, pulse 86, temperature 97.8 F (36.6 C), temperature source Oral, resp. rate 18, height 5\' 8"  (1.727 m), weight 67.132 kg (148 lb), SpO2 97.00%.Body mass index is 22.51 kg/(m^2).  General Appearance: Casual  Eye Contact::  Good  Speech:  Clear and Coherent  Volume:  Normal  Mood:  Anxious  Affect:  Appropriate and Congruent  Thought Process:  Coherent and Goal Directed  Orientation:  Full (Time, Place, and Person)  Thought Content:  WDL  Suicidal Thoughts:  No  Homicidal Thoughts:  No  Memory:  Immediate;   Good  Judgement:  Good  Insight:  Fair  Psychomotor Activity:  Decreased  Concentration:  Fair  Recall:  Good  Fund of Knowledge:Fair  Language: Good  Akathisia:  NA  Handed:  Right  AIMS (if indicated):     Assets:  Communication Skills Desire for Improvement Housing Intimacy Physical Health Resilience Social Support Transportation  Sleep:  Number of Hours: 6.75    Musculoskeletal: Strength & Muscle Tone: within normal limits Gait & Station: unsteady Patient leans: Front   Mental Status Per Nursing Assessment::   On Admission:  Self-harm thoughts  Current Mental Status by Physician: patient stated mood is anxious and affect is flat. he has no suicidal or homicidal ideations and has been completed detox and planned to participate in rehab center and leaving today as scheduled.  Loss Factors: Decrease in vocational status, Decline in physical health and Financial problems/change in socioeconomic status  Historical Factors: Prior suicide attempts, Family history of mental illness or substance abuse and Impulsivity  Risk Reduction Factors:   Sense of responsibility to family, Religious beliefs about death, Living with another person, especially a  relative, Positive social support, Positive therapeutic relationship and Positive coping skills or problem solving skills  Continued Clinical Symptoms:  Depression:   Recent sense of peace/wellbeing Alcohol/Substance Abuse/Dependencies Chronic Pain Previous Psychiatric Diagnoses and Treatments Medical Diagnoses and Treatments/Surgeries  Cognitive Features That Contribute To Risk:  Polarized thinking    Suicide Risk:  Minimal: No identifiable suicidal ideation.  Patients presenting with no risk factors but with morbid ruminations; may be classified as minimal risk based on the severity of the depressive symptoms  Discharge Diagnoses:   AXIS I:  Major Depression, Recurrent severe and Alcohol dependence AXIS II:  Deferred AXIS III:   Past Medical History  Diagnosis Date  . Pancreatitis   . Hypertension   . Arthritis   . Coronary artery disease   . Hepatitis C   . Shortness of breath   . Depression   . Cancer     stomach   AXIS IV:  economic problems, other psychosocial or environmental problems, problems related to social environment and problems with primary support group AXIS V:  61-70 mild symptoms  Plan Of Care/Follow-up recommendations:  Activity:  as tolerated Diet:  Regular  Is patient on multiple antipsychotic therapies at discharge:  No   Has Patient had three or more failed trials of antipsychotic monotherapy by history:  No  Recommended Plan for Multiple Antipsychotic Therapies: NA    Elijah [name] 05/01/2013, 11:16 AM

## 2013-05-01 NOTE — Progress Notes (Signed)
1330 Pt remains on 1:1 for safety  He is scheduled to leave today for [facility] at 1445.

## 2013-05-06 NOTE — Progress Notes (Signed)
Patient Discharge Instructions:  After Visit Summary (AVS):   Faxed to:  05/06/13 Discharge Summary Note:   Faxed to:  05/06/13 Psychiatric Admission Assessment Note:   Faxed to:  05/06/13 Suicide Risk Assessment - Discharge Assessment:   Faxed to:  05/06/13 Faxed/Sent to the Next Level Care provider:  05/06/13 Faxed to [facility] [place], 05/06/2013, 4:41 PM
